# Patient Record
Sex: Female | Born: 1955 | Race: Black or African American | Hispanic: No | Marital: Single | Smoking: Former smoker
Health system: Southern US, Community
[De-identification: ages and names within clinical notes are randomized; demographics above are authoritative.]

## PROBLEM LIST (undated history)

## (undated) DIAGNOSIS — E785 Hyperlipidemia, unspecified: Secondary | ICD-10-CM

## (undated) DIAGNOSIS — Z9884 Bariatric surgery status: Secondary | ICD-10-CM

## (undated) DIAGNOSIS — I82409 Acute embolism and thrombosis of unspecified deep veins of unspecified lower extremity: Secondary | ICD-10-CM

## (undated) DIAGNOSIS — M329 Systemic lupus erythematosus, unspecified: Secondary | ICD-10-CM

## (undated) DIAGNOSIS — IMO0002 Reserved for concepts with insufficient information to code with codable children: Secondary | ICD-10-CM

## (undated) DIAGNOSIS — N189 Chronic kidney disease, unspecified: Secondary | ICD-10-CM

## (undated) DIAGNOSIS — G4733 Obstructive sleep apnea (adult) (pediatric): Secondary | ICD-10-CM

## (undated) DIAGNOSIS — K922 Gastrointestinal hemorrhage, unspecified: Secondary | ICD-10-CM

## (undated) DIAGNOSIS — R000 Tachycardia, unspecified: Secondary | ICD-10-CM

## (undated) DIAGNOSIS — D649 Anemia, unspecified: Secondary | ICD-10-CM

## (undated) HISTORY — PX: ABDOMINAL HYSTERECTOMY: SHX81

## (undated) HISTORY — DX: Anemia, unspecified: D64.9

## (undated) HISTORY — DX: Morbid (severe) obesity due to excess calories: E66.01

## (undated) HISTORY — PX: GASTRIC BYPASS: SHX52

## (undated) HISTORY — DX: Chronic kidney disease, unspecified: N18.9

## (undated) HISTORY — DX: Tachycardia, unspecified: R00.0

## (undated) HISTORY — DX: Reserved for concepts with insufficient information to code with codable children: IMO0002

## (undated) HISTORY — DX: Obstructive sleep apnea (adult) (pediatric): G47.33

## (undated) HISTORY — DX: Acute embolism and thrombosis of unspecified deep veins of unspecified lower extremity: I82.409

## (undated) HISTORY — DX: Systemic lupus erythematosus, unspecified: M32.9

## (undated) HISTORY — PX: INFERIOR VENA CAVA ANGIOPLASTY / STENTING: SHX1821

## (undated) HISTORY — DX: Hyperlipidemia, unspecified: E78.5

## (undated) HISTORY — DX: Gastrointestinal hemorrhage, unspecified: K92.2

---

## 2000-09-19 ENCOUNTER — Encounter: Admission: RE | Admit: 2000-09-19 | Discharge: 2000-09-19 | Payer: Self-pay | Admitting: Orthopedic Surgery

## 2000-09-19 ENCOUNTER — Encounter: Payer: Self-pay | Admitting: Orthopedic Surgery

## 2001-07-09 ENCOUNTER — Encounter: Admission: RE | Admit: 2001-07-09 | Discharge: 2001-07-09 | Payer: Self-pay | Admitting: Orthopedic Surgery

## 2001-07-09 ENCOUNTER — Encounter: Payer: Self-pay | Admitting: Orthopedic Surgery

## 2001-07-17 ENCOUNTER — Encounter: Payer: Self-pay | Admitting: Orthopedic Surgery

## 2001-07-17 ENCOUNTER — Encounter: Admission: RE | Admit: 2001-07-17 | Discharge: 2001-07-17 | Payer: Self-pay | Admitting: Orthopedic Surgery

## 2002-03-22 ENCOUNTER — Encounter: Payer: Self-pay | Admitting: Nephrology

## 2002-03-22 ENCOUNTER — Encounter: Admission: RE | Admit: 2002-03-22 | Discharge: 2002-03-22 | Payer: Self-pay | Admitting: Nephrology

## 2002-04-15 ENCOUNTER — Ambulatory Visit (HOSPITAL_COMMUNITY): Admission: RE | Admit: 2002-04-15 | Discharge: 2002-04-15 | Payer: Self-pay | Admitting: Nephrology

## 2002-04-16 ENCOUNTER — Ambulatory Visit (HOSPITAL_COMMUNITY): Admission: RE | Admit: 2002-04-16 | Discharge: 2002-04-17 | Payer: Self-pay | Admitting: Nephrology

## 2002-04-16 ENCOUNTER — Encounter (INDEPENDENT_AMBULATORY_CARE_PROVIDER_SITE_OTHER): Payer: Self-pay | Admitting: Specialist

## 2002-04-16 ENCOUNTER — Encounter: Payer: Self-pay | Admitting: Nephrology

## 2004-04-25 ENCOUNTER — Encounter (HOSPITAL_COMMUNITY): Admission: RE | Admit: 2004-04-25 | Discharge: 2004-07-24 | Payer: Self-pay | Admitting: Nephrology

## 2004-05-14 ENCOUNTER — Ambulatory Visit (HOSPITAL_BASED_OUTPATIENT_CLINIC_OR_DEPARTMENT_OTHER): Admission: RE | Admit: 2004-05-14 | Discharge: 2004-05-14 | Payer: Self-pay | Admitting: Family Medicine

## 2004-08-01 ENCOUNTER — Encounter (HOSPITAL_COMMUNITY): Admission: RE | Admit: 2004-08-01 | Discharge: 2004-10-30 | Payer: Self-pay | Admitting: Nephrology

## 2004-11-07 ENCOUNTER — Encounter (HOSPITAL_COMMUNITY): Admission: RE | Admit: 2004-11-07 | Discharge: 2005-02-05 | Payer: Self-pay | Admitting: Nephrology

## 2005-02-06 ENCOUNTER — Encounter (HOSPITAL_COMMUNITY): Admission: RE | Admit: 2005-02-06 | Discharge: 2005-05-07 | Payer: Self-pay | Admitting: Nephrology

## 2005-05-22 ENCOUNTER — Encounter (HOSPITAL_COMMUNITY): Admission: RE | Admit: 2005-05-22 | Discharge: 2005-08-20 | Payer: Self-pay | Admitting: Nephrology

## 2005-08-22 ENCOUNTER — Encounter (HOSPITAL_COMMUNITY): Admission: RE | Admit: 2005-08-22 | Discharge: 2005-11-20 | Payer: Self-pay | Admitting: Nephrology

## 2005-11-27 ENCOUNTER — Encounter (HOSPITAL_COMMUNITY): Admission: RE | Admit: 2005-11-27 | Discharge: 2006-02-25 | Payer: Self-pay | Admitting: Nephrology

## 2006-02-27 ENCOUNTER — Encounter (HOSPITAL_COMMUNITY): Admission: RE | Admit: 2006-02-27 | Discharge: 2006-05-28 | Payer: Self-pay | Admitting: Nephrology

## 2006-06-04 ENCOUNTER — Encounter (HOSPITAL_COMMUNITY): Admission: RE | Admit: 2006-06-04 | Discharge: 2006-08-01 | Payer: Self-pay | Admitting: Nephrology

## 2006-10-01 ENCOUNTER — Encounter (HOSPITAL_COMMUNITY): Admission: RE | Admit: 2006-10-01 | Discharge: 2006-12-30 | Payer: Self-pay | Admitting: Nephrology

## 2006-12-16 ENCOUNTER — Encounter: Admission: RE | Admit: 2006-12-16 | Discharge: 2007-03-16 | Payer: Self-pay | Admitting: Nephrology

## 2006-12-17 ENCOUNTER — Ambulatory Visit (HOSPITAL_COMMUNITY): Admission: RE | Admit: 2006-12-17 | Discharge: 2006-12-17 | Payer: Self-pay | Admitting: Family Medicine

## 2007-02-03 ENCOUNTER — Encounter: Admission: RE | Admit: 2007-02-03 | Discharge: 2007-02-03 | Payer: Self-pay | Admitting: Orthopaedic Surgery

## 2007-06-02 ENCOUNTER — Encounter (HOSPITAL_COMMUNITY): Admission: RE | Admit: 2007-06-02 | Discharge: 2007-08-31 | Payer: Self-pay | Admitting: Nephrology

## 2007-08-11 ENCOUNTER — Encounter: Admission: RE | Admit: 2007-08-11 | Discharge: 2007-08-11 | Payer: Self-pay | Admitting: Orthopaedic Surgery

## 2007-09-01 ENCOUNTER — Encounter: Admission: RE | Admit: 2007-09-01 | Discharge: 2007-11-27 | Payer: Self-pay | Admitting: Nephrology

## 2007-10-05 ENCOUNTER — Encounter: Admission: RE | Admit: 2007-10-05 | Discharge: 2008-01-03 | Payer: Self-pay | Admitting: Nephrology

## 2007-12-01 ENCOUNTER — Encounter (HOSPITAL_COMMUNITY): Admission: RE | Admit: 2007-12-01 | Discharge: 2008-02-29 | Payer: Self-pay | Admitting: Nephrology

## 2007-12-31 ENCOUNTER — Ambulatory Visit (HOSPITAL_COMMUNITY): Admission: RE | Admit: 2007-12-31 | Discharge: 2007-12-31 | Payer: Self-pay | Admitting: Surgery

## 2008-01-04 ENCOUNTER — Ambulatory Visit (HOSPITAL_COMMUNITY): Admission: RE | Admit: 2008-01-04 | Discharge: 2008-01-04 | Payer: Self-pay | Admitting: Surgery

## 2008-01-05 ENCOUNTER — Encounter: Admission: RE | Admit: 2008-01-05 | Discharge: 2008-01-05 | Payer: Self-pay | Admitting: Orthopaedic Surgery

## 2008-01-06 ENCOUNTER — Encounter: Admission: RE | Admit: 2008-01-06 | Discharge: 2008-01-06 | Payer: Self-pay | Admitting: Surgery

## 2008-03-22 ENCOUNTER — Encounter (HOSPITAL_COMMUNITY): Admission: RE | Admit: 2008-03-22 | Discharge: 2008-06-20 | Payer: Self-pay | Admitting: Nephrology

## 2008-05-06 ENCOUNTER — Encounter: Admission: RE | Admit: 2008-05-06 | Discharge: 2008-06-02 | Payer: Self-pay | Admitting: Surgery

## 2008-05-23 ENCOUNTER — Inpatient Hospital Stay (HOSPITAL_COMMUNITY): Admission: RE | Admit: 2008-05-23 | Discharge: 2008-05-26 | Payer: Self-pay | Admitting: Surgery

## 2008-05-24 ENCOUNTER — Ambulatory Visit: Payer: Self-pay | Admitting: Surgery

## 2008-05-24 ENCOUNTER — Encounter (INDEPENDENT_AMBULATORY_CARE_PROVIDER_SITE_OTHER): Payer: Self-pay | Admitting: Surgery

## 2008-06-22 ENCOUNTER — Encounter (HOSPITAL_COMMUNITY): Admission: RE | Admit: 2008-06-22 | Discharge: 2008-09-20 | Payer: Self-pay | Admitting: Nephrology

## 2008-09-21 ENCOUNTER — Encounter (HOSPITAL_COMMUNITY): Admission: RE | Admit: 2008-09-21 | Discharge: 2008-11-17 | Payer: Self-pay | Admitting: Nephrology

## 2008-11-18 HISTORY — PX: LAPAROSCOPIC GASTRIC BYPASS: SUR771

## 2008-11-30 ENCOUNTER — Encounter (HOSPITAL_COMMUNITY): Admission: RE | Admit: 2008-11-30 | Discharge: 2009-02-28 | Payer: Self-pay | Admitting: Nephrology

## 2009-03-01 ENCOUNTER — Encounter (HOSPITAL_COMMUNITY): Admission: RE | Admit: 2009-03-01 | Discharge: 2009-05-30 | Payer: Self-pay | Admitting: Nephrology

## 2009-05-31 ENCOUNTER — Encounter (HOSPITAL_COMMUNITY): Admission: RE | Admit: 2009-05-31 | Discharge: 2009-08-29 | Payer: Self-pay | Admitting: Nephrology

## 2009-09-27 ENCOUNTER — Encounter (HOSPITAL_COMMUNITY): Admission: RE | Admit: 2009-09-27 | Discharge: 2009-12-26 | Payer: Self-pay | Admitting: Nephrology

## 2009-10-17 ENCOUNTER — Inpatient Hospital Stay (HOSPITAL_COMMUNITY): Admission: EM | Admit: 2009-10-17 | Discharge: 2009-10-19 | Payer: Self-pay | Admitting: Emergency Medicine

## 2009-10-17 ENCOUNTER — Encounter (INDEPENDENT_AMBULATORY_CARE_PROVIDER_SITE_OTHER): Payer: Self-pay | Admitting: Internal Medicine

## 2009-12-13 ENCOUNTER — Inpatient Hospital Stay (HOSPITAL_COMMUNITY): Admission: AD | Admit: 2009-12-13 | Discharge: 2009-12-14 | Payer: Self-pay | Admitting: Nephrology

## 2009-12-28 ENCOUNTER — Encounter (HOSPITAL_COMMUNITY): Admission: RE | Admit: 2009-12-28 | Discharge: 2010-03-28 | Payer: Self-pay | Admitting: Nephrology

## 2010-03-05 ENCOUNTER — Inpatient Hospital Stay (HOSPITAL_COMMUNITY): Admission: EM | Admit: 2010-03-05 | Discharge: 2010-03-07 | Payer: Self-pay | Admitting: Emergency Medicine

## 2010-03-05 ENCOUNTER — Ambulatory Visit: Payer: Self-pay | Admitting: Surgery

## 2010-03-06 ENCOUNTER — Encounter (INDEPENDENT_AMBULATORY_CARE_PROVIDER_SITE_OTHER): Payer: Self-pay | Admitting: Internal Medicine

## 2010-03-11 ENCOUNTER — Inpatient Hospital Stay (HOSPITAL_COMMUNITY): Admission: EM | Admit: 2010-03-11 | Discharge: 2010-03-16 | Payer: Self-pay | Admitting: Emergency Medicine

## 2010-03-15 ENCOUNTER — Encounter (INDEPENDENT_AMBULATORY_CARE_PROVIDER_SITE_OTHER): Payer: Self-pay | Admitting: Internal Medicine

## 2010-03-29 ENCOUNTER — Encounter (HOSPITAL_COMMUNITY): Admission: RE | Admit: 2010-03-29 | Discharge: 2010-06-27 | Payer: Self-pay | Admitting: Nephrology

## 2010-06-28 ENCOUNTER — Encounter (HOSPITAL_COMMUNITY): Admission: RE | Admit: 2010-06-28 | Discharge: 2010-09-26 | Payer: Self-pay | Admitting: Nephrology

## 2010-08-31 ENCOUNTER — Ambulatory Visit: Payer: Self-pay | Admitting: Vascular Surgery

## 2010-09-24 ENCOUNTER — Ambulatory Visit (HOSPITAL_COMMUNITY): Admission: RE | Admit: 2010-09-24 | Discharge: 2010-09-24 | Payer: Self-pay | Admitting: Vascular Surgery

## 2010-10-04 ENCOUNTER — Encounter (HOSPITAL_COMMUNITY)
Admission: RE | Admit: 2010-10-04 | Discharge: 2010-12-18 | Payer: Self-pay | Source: Home / Self Care | Attending: Nephrology | Admitting: Nephrology

## 2010-10-26 ENCOUNTER — Ambulatory Visit: Payer: Self-pay | Admitting: Vascular Surgery

## 2010-11-22 LAB — IRON AND TIBC
Iron: 119 ug/dL (ref 42–135)
Saturation Ratios: 55 % (ref 20–55)
TIBC: 218 ug/dL — ABNORMAL LOW (ref 250–470)
UIBC: 99 ug/dL

## 2010-11-22 LAB — POCT HEMOGLOBIN-HEMACUE: Hemoglobin: 8.1 g/dL — ABNORMAL LOW (ref 12.0–15.0)

## 2010-11-22 LAB — FERRITIN: Ferritin: 907 ng/mL — ABNORMAL HIGH (ref 10–291)

## 2010-12-03 LAB — POCT HEMOGLOBIN-HEMACUE: Hemoglobin: 7.6 g/dL — ABNORMAL LOW (ref 12.0–15.0)

## 2010-12-09 ENCOUNTER — Encounter: Payer: Self-pay | Admitting: Gastroenterology

## 2010-12-10 ENCOUNTER — Other Ambulatory Visit: Payer: Self-pay | Admitting: Gastroenterology

## 2010-12-10 LAB — CBC
Hemoglobin: 7.3 g/dL — ABNORMAL LOW (ref 12.0–15.0)
MCH: 28.2 pg (ref 26.0–34.0)
MCV: 96.1 fL (ref 78.0–100.0)
Platelets: 301 10*3/uL (ref 150–400)
RBC: 2.59 MIL/uL — ABNORMAL LOW (ref 3.87–5.11)
WBC: 11.8 10*3/uL — ABNORMAL HIGH (ref 4.0–10.5)

## 2010-12-13 LAB — POCT HEMOGLOBIN-HEMACUE: Hemoglobin: 7.9 g/dL — ABNORMAL LOW (ref 12.0–15.0)

## 2010-12-20 ENCOUNTER — Other Ambulatory Visit: Payer: Self-pay | Admitting: Nephrology

## 2010-12-20 ENCOUNTER — Other Ambulatory Visit: Payer: Self-pay

## 2010-12-20 ENCOUNTER — Ambulatory Visit (HOSPITAL_COMMUNITY): Payer: 59 | Attending: Nephrology

## 2010-12-20 DIAGNOSIS — D638 Anemia in other chronic diseases classified elsewhere: Secondary | ICD-10-CM | POA: Insufficient documentation

## 2010-12-20 DIAGNOSIS — N185 Chronic kidney disease, stage 5: Secondary | ICD-10-CM | POA: Insufficient documentation

## 2010-12-20 LAB — IRON AND TIBC
Saturation Ratios: 17 % — ABNORMAL LOW (ref 20–55)
TIBC: 194 ug/dL — ABNORMAL LOW (ref 250–470)
UIBC: 161 ug/dL

## 2010-12-27 ENCOUNTER — Encounter (HOSPITAL_COMMUNITY): Payer: 59 | Attending: Nephrology

## 2010-12-27 ENCOUNTER — Other Ambulatory Visit: Payer: Self-pay

## 2010-12-27 DIAGNOSIS — N185 Chronic kidney disease, stage 5: Secondary | ICD-10-CM | POA: Insufficient documentation

## 2010-12-27 DIAGNOSIS — D638 Anemia in other chronic diseases classified elsewhere: Secondary | ICD-10-CM | POA: Insufficient documentation

## 2010-12-27 LAB — POCT HEMOGLOBIN-HEMACUE: Hemoglobin: 11.1 g/dL — ABNORMAL LOW (ref 12.0–15.0)

## 2011-01-03 ENCOUNTER — Other Ambulatory Visit: Payer: Self-pay

## 2011-01-03 ENCOUNTER — Encounter (HOSPITAL_COMMUNITY): Payer: 59

## 2011-01-04 ENCOUNTER — Ambulatory Visit (INDEPENDENT_AMBULATORY_CARE_PROVIDER_SITE_OTHER): Payer: 59 | Admitting: Vascular Surgery

## 2011-01-04 DIAGNOSIS — N186 End stage renal disease: Secondary | ICD-10-CM

## 2011-01-04 NOTE — Assessment & Plan Note (Signed)
OFFICE VISIT  [name], [name] DOB:  [birth year]                                       01/04/2011 ZOXWR#:[id number]  This is an established patient.  HISTORY OF PRESENT ILLNESS:  This is a 55 year old female who underwent a first stage basilic vein transposition on 09/24/2010.  Since then the patient has followed up and on subsequent followup was found to have no obvious thrill within the basilic vein transposition.  At this point the patient has no steal symptomatology, has full use of the left hand for activities of daily living and currently is in the process of transplant evaluation at [facility].  She does not note any deterioration of renal function at this point and per her most recent visit with her nephrologist is not in the 1 to 2 month window for imminent end-stage renal disease.  PHYSICAL EXAMINATION:  Vital signs:  She had a blood pressure of 103/70 with a heart rate of 72, respirations were 12, temperature 97.4.  On focused examination:  Her left arm has a well-healed incision.  There is no palpable thrill here, easily palpable brachial pulse and a weak radial pulse.  She has 5/5 hand grip in the left hand, sensation is grossly intact in the fingertips.  There is intact intrinsic/extrinsic motor strength in this hand.  Cardiac:  She had a regular rate and rhythm.  Normal S1-S2.  No murmurs, rubs, thrills or gallops. Pulmonary:  Symmetric expansion, good air movement, no rales, rhonchi or wheezing.  Musculoskeletal:  She had 5/5 strength in all extremities. There was no edema in either extremities.  MEDICAL DECISION MAKING:  This is a 55 year old female status post failed left basilic vein transposition.  In reviewing the patient's vein mapping she has no other options for fistula placement.  At this point she is still in chronic kidney disease and has not entered into end- stage renal.  As she is not within the 1-2 month window of imminent  end- stage renal disease I do not see an advantage to proceeding with graft placement.  She is going to continue with her transplant evaluations and then once she enters into the 1-2 month window then will schedule her for a left arm forearm arteriovenous graft or upper arm arteriovenous graft placement depending on the presence of acceptable brachial vein on exploration intraop.  We discussed this with the patient.  At this point she is fine with this and will continue with this plan.    Fransisco Hertz, MD Electronically Signed  BLC/MEDQ  D:  01/04/2011  T:  01/04/2011  Job:  [id number]

## 2011-01-08 LAB — POCT HEMOGLOBIN-HEMACUE: Hemoglobin: 12.1 g/dL (ref 12.0–15.0)

## 2011-01-17 ENCOUNTER — Encounter (HOSPITAL_COMMUNITY): Payer: 59 | Attending: Nephrology

## 2011-01-17 ENCOUNTER — Other Ambulatory Visit: Payer: Self-pay | Admitting: Nephrology

## 2011-01-17 ENCOUNTER — Other Ambulatory Visit: Payer: Self-pay

## 2011-01-17 DIAGNOSIS — N185 Chronic kidney disease, stage 5: Secondary | ICD-10-CM | POA: Insufficient documentation

## 2011-01-17 DIAGNOSIS — D638 Anemia in other chronic diseases classified elsewhere: Secondary | ICD-10-CM | POA: Insufficient documentation

## 2011-01-17 LAB — FERRITIN: Ferritin: 1075 ng/mL — ABNORMAL HIGH (ref 10–291)

## 2011-01-22 ENCOUNTER — Other Ambulatory Visit: Payer: Self-pay | Admitting: Radiology

## 2011-01-24 ENCOUNTER — Encounter (HOSPITAL_COMMUNITY): Payer: 59

## 2011-01-24 ENCOUNTER — Other Ambulatory Visit: Payer: Self-pay

## 2011-01-24 LAB — POCT HEMOGLOBIN-HEMACUE: Hemoglobin: 11.9 g/dL — ABNORMAL LOW (ref 12.0–15.0)

## 2011-01-28 LAB — RENAL FUNCTION PANEL
Albumin: 3.8 g/dL (ref 3.5–5.2)
Albumin: 3.6 g/dL (ref 3.5–5.2)
Calcium: 8.7 mg/dL (ref 8.4–10.5)
Chloride: 101 mEq/L (ref 96–112)
Creatinine, Ser: 4.31 mg/dL — ABNORMAL HIGH (ref 0.4–1.2)
GFR calc Af Amer: 13 mL/min — ABNORMAL LOW (ref >60)
GFR calc non Af Amer: 11 mL/min — ABNORMAL LOW (ref >60)
Glucose, Bld: 72 mg/dL (ref 70–99)
Phosphorus: 5 mg/dL — ABNORMAL HIGH (ref 2.3–4.6)
Phosphorus: 4.9 mg/dL — ABNORMAL HIGH (ref 2.3–4.6)
Potassium: 3.5 mEq/L (ref 3.5–5.1)
Sodium: 142 mEq/L (ref 135–145)
Sodium: 136 mEq/L (ref 135–145)

## 2011-01-28 LAB — CBC
HCT: 32.9 % — ABNORMAL LOW (ref 36.0–46.0)
MCH: 28.3 pg (ref 26.0–34.0)
MCHC: 29.7 g/dL — ABNORMAL LOW (ref 30.0–36.0)
Platelets: 325 10*3/uL (ref 150–400)
RBC: 3.44 MIL/uL — ABNORMAL LOW (ref 3.87–5.11)
RDW: 15.2 % (ref 11.5–15.5)
RDW: 14.8 % (ref 11.5–15.5)
WBC: 10.7 10*3/uL — ABNORMAL HIGH (ref 4.0–10.5)

## 2011-01-28 LAB — POCT HEMOGLOBIN-HEMACUE
Hemoglobin: 8.7 g/dL — ABNORMAL LOW (ref 12.0–15.0)
Hemoglobin: 9.5 g/dL — ABNORMAL LOW (ref 12.0–15.0)

## 2011-01-28 LAB — IRON AND TIBC: TIBC: 224 ug/dL — ABNORMAL LOW (ref 250–470)

## 2011-01-28 LAB — C4 COMPLEMENT: Complement C4, Body Fluid: 13 mg/dL — ABNORMAL LOW (ref 16–47)

## 2011-01-28 LAB — FERRITIN: Ferritin: 859 ng/mL — ABNORMAL HIGH (ref 10–291)

## 2011-01-28 LAB — C3 COMPLEMENT: C3 Complement: 80 mg/dL — ABNORMAL LOW (ref 88–201)

## 2011-01-29 LAB — IRON AND TIBC
Iron: 71 ug/dL (ref 42–135)
Saturation Ratios: 30 % (ref 20–55)
TIBC: 236 ug/dL — ABNORMAL LOW (ref 250–470)

## 2011-01-29 LAB — SURGICAL PCR SCREEN: MRSA, PCR: POSITIVE — AB

## 2011-01-29 LAB — CBC
HCT: 33.1 % — ABNORMAL LOW (ref 36.0–46.0)
MCHC: 30.2 g/dL (ref 30.0–36.0)
Platelets: 273 10*3/uL (ref 150–400)
RDW: 15.1 % (ref 11.5–15.5)
WBC: 8.9 10*3/uL (ref 4.0–10.5)

## 2011-01-29 LAB — BASIC METABOLIC PANEL
BUN: 89 mg/dL — ABNORMAL HIGH (ref 6–23)
Calcium: 8.4 mg/dL (ref 8.4–10.5)
Creatinine, Ser: 3.48 mg/dL — ABNORMAL HIGH (ref 0.4–1.2)
GFR calc non Af Amer: 14 mL/min — ABNORMAL LOW (ref >60)
Glucose, Bld: 109 mg/dL — ABNORMAL HIGH (ref 70–99)

## 2011-01-29 LAB — FERRITIN: Ferritin: 746 ng/mL — ABNORMAL HIGH (ref 10–291)

## 2011-01-29 LAB — POCT I-STAT 4, (NA,K, GLUC, HGB,HCT): HCT: 28 % — ABNORMAL LOW (ref 36.0–46.0)

## 2011-01-29 LAB — POCT HEMOGLOBIN-HEMACUE: Hemoglobin: 9.6 g/dL — ABNORMAL LOW (ref 12.0–15.0)

## 2011-01-30 LAB — POCT HEMOGLOBIN-HEMACUE
Hemoglobin: 9.7 g/dL — ABNORMAL LOW (ref 12.0–15.0)
Hemoglobin: 9.6 g/dL — ABNORMAL LOW (ref 12.0–15.0)

## 2011-01-30 LAB — IRON AND TIBC
Iron: 96 ug/dL (ref 42–135)
Saturation Ratios: 40 % (ref 20–55)
TIBC: 242 ug/dL — ABNORMAL LOW (ref 250–470)
UIBC: 146 ug/dL

## 2011-01-30 LAB — FERRITIN: Ferritin: 1045 ng/mL — ABNORMAL HIGH (ref 10–291)

## 2011-01-31 ENCOUNTER — Encounter (HOSPITAL_COMMUNITY): Payer: 59

## 2011-01-31 LAB — POCT HEMOGLOBIN-HEMACUE: Hemoglobin: 10.4 g/dL — ABNORMAL LOW (ref 12.0–15.0)

## 2011-01-31 LAB — IRON AND TIBC
Iron: 101 ug/dL (ref 42–135)
TIBC: 251 ug/dL (ref 250–470)
UIBC: 150 ug/dL

## 2011-01-31 LAB — FERRITIN: Ferritin: 1017 ng/mL — ABNORMAL HIGH (ref 10–291)

## 2011-02-01 LAB — FERRITIN: Ferritin: 1163 ng/mL — ABNORMAL HIGH (ref 10–291)

## 2011-02-01 LAB — POCT HEMOGLOBIN-HEMACUE: Hemoglobin: 10.8 g/dL — ABNORMAL LOW (ref 12.0–15.0)

## 2011-02-01 LAB — IRON AND TIBC: Iron: 86 ug/dL (ref 42–135)

## 2011-02-03 LAB — C4 COMPLEMENT: Complement C4, Body Fluid: 11 mg/dL — ABNORMAL LOW (ref 16–47)

## 2011-02-03 LAB — CALCIUM, IONIZED: Calcium, Ion: 0.9 mmol/L — ABNORMAL LOW (ref 1.12–1.32)

## 2011-02-03 LAB — RENAL FUNCTION PANEL
CO2: 31 mEq/L (ref 19–32)
Glucose, Bld: 85 mg/dL (ref 70–99)
Phosphorus: 4.1 mg/dL (ref 2.3–4.6)
Potassium: 3.5 mEq/L (ref 3.5–5.1)
Sodium: 145 mEq/L (ref 135–145)

## 2011-02-03 LAB — IRON AND TIBC
Iron: 40 ug/dL — ABNORMAL LOW (ref 42–135)
Saturation Ratios: 18 % — ABNORMAL LOW (ref 20–55)
TIBC: 225 ug/dL — ABNORMAL LOW (ref 250–470)

## 2011-02-03 LAB — COMPREHENSIVE METABOLIC PANEL
AST: 36 U/L (ref 0–37)
Albumin: 2.6 g/dL — ABNORMAL LOW (ref 3.5–5.2)
Alkaline Phosphatase: 161 U/L — ABNORMAL HIGH (ref 39–117)
Chloride: 93 mEq/L — ABNORMAL LOW (ref 96–112)
GFR calc Af Amer: 16 mL/min — ABNORMAL LOW (ref >60)
Potassium: 3 mEq/L — ABNORMAL LOW (ref 3.5–5.1)
Total Bilirubin: 0.5 mg/dL (ref 0.3–1.2)

## 2011-02-03 LAB — URINALYSIS, ROUTINE W REFLEX MICROSCOPIC
Glucose, UA: NEGATIVE mg/dL
Specific Gravity, Urine: 1.011 (ref 1.005–1.030)
pH: 6 (ref 5.0–8.0)

## 2011-02-03 LAB — CBC
HCT: 27.7 % — ABNORMAL LOW (ref 36.0–46.0)
Hemoglobin: 8.9 g/dL — ABNORMAL LOW (ref 12.0–15.0)
Platelets: 243 10*3/uL (ref 150–400)
RBC: 2.94 MIL/uL — ABNORMAL LOW (ref 3.87–5.11)
WBC: 11.5 10*3/uL — ABNORMAL HIGH (ref 4.0–10.5)

## 2011-02-03 LAB — POCT HEMOGLOBIN-HEMACUE: Hemoglobin: 11.5 g/dL — ABNORMAL LOW (ref 12.0–15.0)

## 2011-02-04 LAB — IRON AND TIBC: TIBC: 190 ug/dL — ABNORMAL LOW (ref 250–470)

## 2011-02-04 LAB — POCT HEMOGLOBIN-HEMACUE
Hemoglobin: 12.6 g/dL (ref 12.0–15.0)
Hemoglobin: 10.8 g/dL — ABNORMAL LOW (ref 12.0–15.0)

## 2011-02-05 LAB — DIFFERENTIAL
Basophils Absolute: 0 10*3/uL (ref 0.0–0.1)
Basophils Absolute: 0.2 10*3/uL — ABNORMAL HIGH (ref 0.0–0.1)
Basophils Relative: 0 % (ref 0–1)
Basophils Relative: 1 % (ref 0–1)
Eosinophils Absolute: 0 10*3/uL (ref 0.0–0.7)
Eosinophils Relative: 0 % (ref 0–5)
Eosinophils Relative: 1 % (ref 0–5)
Monocytes Absolute: 0.7 10*3/uL (ref 0.1–1.0)
Monocytes Absolute: 1.1 10*3/uL — ABNORMAL HIGH (ref 0.1–1.0)
Neutro Abs: 8.3 10*3/uL — ABNORMAL HIGH (ref 1.7–7.7)

## 2011-02-05 LAB — CBC
HCT: 25.4 % — ABNORMAL LOW (ref 36.0–46.0)
HCT: 27.6 % — ABNORMAL LOW (ref 36.0–46.0)
HCT: 28.3 % — ABNORMAL LOW (ref 36.0–46.0)
HCT: 32.1 % — ABNORMAL LOW (ref 36.0–46.0)
HCT: 34 % — ABNORMAL LOW (ref 36.0–46.0)
Hemoglobin: 10.3 g/dL — ABNORMAL LOW (ref 12.0–15.0)
Hemoglobin: 11 g/dL — ABNORMAL LOW (ref 12.0–15.0)
Hemoglobin: 8.1 g/dL — ABNORMAL LOW (ref 12.0–15.0)
MCHC: 32.3 g/dL (ref 30.0–36.0)
MCHC: 32.5 g/dL (ref 30.0–36.0)
MCV: 95.6 fL (ref 78.0–100.0)
MCV: 95.7 fL (ref 78.0–100.0)
MCV: 95.8 fL (ref 78.0–100.0)
Platelets: 180 10*3/uL (ref 150–400)
Platelets: 235 10*3/uL (ref 150–400)
Platelets: 244 10*3/uL (ref 150–400)
Platelets: 261 10*3/uL (ref 150–400)
Platelets: 269 10*3/uL (ref 150–400)
Platelets: 277 10*3/uL (ref 150–400)
RDW: 16.3 % — ABNORMAL HIGH (ref 11.5–15.5)
RDW: 16.6 % — ABNORMAL HIGH (ref 11.5–15.5)
RDW: 16.9 % — ABNORMAL HIGH (ref 11.5–15.5)
RDW: 16.9 % — ABNORMAL HIGH (ref 11.5–15.5)
RDW: 17.2 % — ABNORMAL HIGH (ref 11.5–15.5)
WBC: 10.1 10*3/uL (ref 4.0–10.5)
WBC: 11.4 10*3/uL — ABNORMAL HIGH (ref 4.0–10.5)
WBC: 8.1 10*3/uL (ref 4.0–10.5)
WBC: 8.6 10*3/uL (ref 4.0–10.5)

## 2011-02-05 LAB — CARDIAC PANEL(CRET KIN+CKTOT+MB+TROPI)
CK, MB: 0.9 ng/mL (ref 0.3–4.0)
Relative Index: INVALID (ref 0.0–2.5)
Relative Index: INVALID (ref 0.0–2.5)
Total CK: 44 U/L (ref 7–177)
Troponin I: 0.02 ng/mL (ref 0.00–0.06)
Troponin I: 0.02 ng/mL (ref 0.00–0.06)
Troponin I: 0.03 ng/mL (ref 0.00–0.06)

## 2011-02-05 LAB — BASIC METABOLIC PANEL
BUN: 106 mg/dL — ABNORMAL HIGH (ref 6–23)
BUN: 82 mg/dL — ABNORMAL HIGH (ref 6–23)
BUN: 95 mg/dL — ABNORMAL HIGH (ref 6–23)
CO2: 25 mEq/L (ref 19–32)
CO2: 25 mEq/L (ref 19–32)
CO2: 25 mEq/L (ref 19–32)
CO2: 26 mEq/L (ref 19–32)
CO2: 27 mEq/L (ref 19–32)
CO2: 28 mEq/L (ref 19–32)
CO2: 28 mEq/L (ref 19–32)
Calcium: 6.6 mg/dL — ABNORMAL LOW (ref 8.4–10.5)
Calcium: 6.8 mg/dL — ABNORMAL LOW (ref 8.4–10.5)
Calcium: 6.9 mg/dL — ABNORMAL LOW (ref 8.4–10.5)
Chloride: 100 mEq/L (ref 96–112)
Chloride: 102 mEq/L (ref 96–112)
Chloride: 102 mEq/L (ref 96–112)
Chloride: 104 mEq/L (ref 96–112)
Chloride: 107 mEq/L (ref 96–112)
Creatinine, Ser: 3.67 mg/dL — ABNORMAL HIGH (ref 0.4–1.2)
Creatinine, Ser: 3.75 mg/dL — ABNORMAL HIGH (ref 0.4–1.2)
Creatinine, Ser: 3.94 mg/dL — ABNORMAL HIGH (ref 0.4–1.2)
GFR calc Af Amer: 14 mL/min — ABNORMAL LOW (ref >60)
GFR calc Af Amer: 15 mL/min — ABNORMAL LOW (ref >60)
GFR calc Af Amer: 15 mL/min — ABNORMAL LOW (ref >60)
GFR calc Af Amer: 16 mL/min — ABNORMAL LOW (ref >60)
GFR calc non Af Amer: 12 mL/min — ABNORMAL LOW (ref >60)
GFR calc non Af Amer: 13 mL/min — ABNORMAL LOW (ref >60)
Glucose, Bld: 76 mg/dL (ref 70–99)
Glucose, Bld: 80 mg/dL (ref 70–99)
Glucose, Bld: 81 mg/dL (ref 70–99)
Potassium: 2.9 mEq/L — ABNORMAL LOW (ref 3.5–5.1)
Potassium: 3.1 mEq/L — ABNORMAL LOW (ref 3.5–5.1)
Potassium: 3.2 mEq/L — ABNORMAL LOW (ref 3.5–5.1)
Potassium: 3.5 mEq/L (ref 3.5–5.1)
Sodium: 137 mEq/L (ref 135–145)
Sodium: 140 mEq/L (ref 135–145)
Sodium: 141 mEq/L (ref 135–145)
Sodium: 143 mEq/L (ref 135–145)

## 2011-02-05 LAB — HEPARIN LEVEL (UNFRACTIONATED)
Heparin Unfractionated: 0.9 IU/mL — ABNORMAL HIGH (ref 0.30–0.70)
Heparin Unfractionated: 1.01 IU/mL — ABNORMAL HIGH (ref 0.30–0.70)

## 2011-02-05 LAB — RENAL FUNCTION PANEL
CO2: 29 mEq/L (ref 19–32)
CO2: 30 mEq/L (ref 19–32)
Calcium: 6.7 mg/dL — ABNORMAL LOW (ref 8.4–10.5)
Calcium: 6.8 mg/dL — ABNORMAL LOW (ref 8.4–10.5)
Creatinine, Ser: 3.45 mg/dL — ABNORMAL HIGH (ref 0.4–1.2)
Creatinine, Ser: 3.65 mg/dL — ABNORMAL HIGH (ref 0.4–1.2)
GFR calc Af Amer: 16 mL/min — ABNORMAL LOW (ref >60)
GFR calc Af Amer: 17 mL/min — ABNORMAL LOW (ref >60)
GFR calc non Af Amer: 13 mL/min — ABNORMAL LOW (ref >60)
Glucose, Bld: 87 mg/dL (ref 70–99)
Phosphorus: 3.9 mg/dL (ref 2.3–4.6)
Phosphorus: 4.1 mg/dL (ref 2.3–4.6)
Sodium: 138 mEq/L (ref 135–145)

## 2011-02-05 LAB — COMPREHENSIVE METABOLIC PANEL
ALT: 17 U/L (ref 0–35)
AST: 20 U/L (ref 0–37)
Albumin: 2.8 g/dL — ABNORMAL LOW (ref 3.5–5.2)
Alkaline Phosphatase: 75 U/L (ref 39–117)
BUN: 106 mg/dL — ABNORMAL HIGH (ref 6–23)
Chloride: 103 mEq/L (ref 96–112)
Potassium: 4.7 mEq/L (ref 3.5–5.1)
Total Bilirubin: 0.5 mg/dL (ref 0.3–1.2)

## 2011-02-05 LAB — PROTIME-INR
INR: 1.32 (ref 0.00–1.49)
INR: 2.25 — ABNORMAL HIGH (ref 0.00–1.49)
Prothrombin Time: 15.5 seconds — ABNORMAL HIGH (ref 11.6–15.2)
Prothrombin Time: 17.2 seconds — ABNORMAL HIGH (ref 11.6–15.2)
Prothrombin Time: 20 seconds — ABNORMAL HIGH (ref 11.6–15.2)
Prothrombin Time: 24.7 seconds — ABNORMAL HIGH (ref 11.6–15.2)
Prothrombin Time: 27.2 seconds — ABNORMAL HIGH (ref 11.6–15.2)

## 2011-02-05 LAB — URINE CULTURE
Colony Count: >=100000
Special Requests: NEGATIVE

## 2011-02-05 LAB — POCT HEMOGLOBIN-HEMACUE: Hemoglobin: 11.3 g/dL — ABNORMAL LOW (ref 12.0–15.0)

## 2011-02-05 LAB — URINE MICROSCOPIC-ADD ON

## 2011-02-05 LAB — ABO/RH: ABO/RH(D): A POS

## 2011-02-05 LAB — IRON AND TIBC
Iron: 44 ug/dL (ref 42–135)
UIBC: 115 ug/dL

## 2011-02-05 LAB — HEMOGLOBIN AND HEMATOCRIT, BLOOD
HCT: 26.5 % — ABNORMAL LOW (ref 36.0–46.0)
HCT: 29.2 % — ABNORMAL LOW (ref 36.0–46.0)
Hemoglobin: 8.5 g/dL — ABNORMAL LOW (ref 12.0–15.0)

## 2011-02-05 LAB — URINALYSIS, ROUTINE W REFLEX MICROSCOPIC
Bilirubin Urine: NEGATIVE
Ketones, ur: NEGATIVE mg/dL
Ketones, ur: NEGATIVE mg/dL
Nitrite: NEGATIVE
Protein, ur: NEGATIVE mg/dL
Protein, ur: NEGATIVE mg/dL
Urobilinogen, UA: 0.2 mg/dL (ref 0.0–1.0)
Urobilinogen, UA: 0.2 mg/dL (ref 0.0–1.0)

## 2011-02-05 LAB — CROSSMATCH

## 2011-02-05 LAB — FACTOR 5 LEIDEN

## 2011-02-06 LAB — FERRITIN: Ferritin: 1419 ng/mL — ABNORMAL HIGH (ref 10–291)

## 2011-02-06 LAB — PROTEIN / CREATININE RATIO, URINE
Protein Creatinine Ratio: 0.4 — ABNORMAL HIGH (ref 0.00–0.15)
Total Protein, Urine: 17 mg/dL

## 2011-02-06 LAB — RENAL FUNCTION PANEL
Albumin: 2.6 g/dL — ABNORMAL LOW (ref 3.5–5.2)
BUN: 78 mg/dL — ABNORMAL HIGH (ref 6–23)
Calcium: 8.4 mg/dL (ref 8.4–10.5)
Creatinine, Ser: 2.94 mg/dL — ABNORMAL HIGH (ref 0.4–1.2)
GFR calc Af Amer: 20 mL/min — ABNORMAL LOW (ref >60)
GFR calc non Af Amer: 17 mL/min — ABNORMAL LOW (ref >60)
Phosphorus: 4.4 mg/dL (ref 2.3–4.6)

## 2011-02-06 LAB — IRON AND TIBC
Saturation Ratios: 46 % (ref 20–55)
TIBC: 110 ug/dL — ABNORMAL LOW (ref 250–470)
UIBC: 105 ug/dL

## 2011-02-06 LAB — POCT HEMOGLOBIN-HEMACUE: Hemoglobin: 9.5 g/dL — ABNORMAL LOW (ref 12.0–15.0)

## 2011-02-10 LAB — RENAL FUNCTION PANEL
CO2: 18 mEq/L — ABNORMAL LOW (ref 19–32)
Chloride: 113 mEq/L — ABNORMAL HIGH (ref 96–112)
Glucose, Bld: 94 mg/dL (ref 70–99)
Phosphorus: 5.5 mg/dL — ABNORMAL HIGH (ref 2.3–4.6)
Potassium: 4.2 mEq/L (ref 3.5–5.1)
Sodium: 140 mEq/L (ref 135–145)

## 2011-02-10 LAB — IRON AND TIBC
Iron: 112 ug/dL (ref 42–135)
Saturation Ratios: 25 % (ref 20–55)
Saturation Ratios: 65 % — ABNORMAL HIGH (ref 20–55)

## 2011-02-10 LAB — POCT HEMOGLOBIN-HEMACUE
Hemoglobin: 10.2 g/dL — ABNORMAL LOW (ref 12.0–15.0)
Hemoglobin: 7.7 g/dL — ABNORMAL LOW (ref 12.0–15.0)
Hemoglobin: 9.2 g/dL — ABNORMAL LOW (ref 12.0–15.0)
Hemoglobin: 9.9 g/dL — ABNORMAL LOW (ref 12.0–15.0)

## 2011-02-10 LAB — CBC
HCT: 27 % — ABNORMAL LOW (ref 36.0–46.0)
Hemoglobin: 8.7 g/dL — ABNORMAL LOW (ref 12.0–15.0)
RBC: 2.75 MIL/uL — ABNORMAL LOW (ref 3.87–5.11)
RDW: 19.6 % — ABNORMAL HIGH (ref 11.5–15.5)
WBC: 10.8 10*3/uL — ABNORMAL HIGH (ref 4.0–10.5)

## 2011-02-13 ENCOUNTER — Encounter (HOSPITAL_COMMUNITY): Payer: 59

## 2011-02-13 ENCOUNTER — Other Ambulatory Visit: Payer: Self-pay | Admitting: Nephrology

## 2011-02-13 LAB — POCT HEMOGLOBIN-HEMACUE: Hemoglobin: 12.7 g/dL (ref 12.0–15.0)

## 2011-02-18 LAB — POCT HEMOGLOBIN-HEMACUE: Hemoglobin: 9.9 g/dL — ABNORMAL LOW (ref 12.0–15.0)

## 2011-02-18 LAB — IRON AND TIBC

## 2011-02-19 LAB — CBC
HCT: 27 % — ABNORMAL LOW (ref 36.0–46.0)
Hemoglobin: 8.8 g/dL — ABNORMAL LOW (ref 12.0–15.0)
MCHC: 32.4 g/dL (ref 30.0–36.0)
RDW: 16.8 % — ABNORMAL HIGH (ref 11.5–15.5)

## 2011-02-19 LAB — HEPATIC FUNCTION PANEL
Bilirubin, Direct: 0.4 mg/dL — ABNORMAL HIGH (ref 0.0–0.3)
Indirect Bilirubin: 0.4 mg/dL (ref 0.3–0.9)
Total Bilirubin: 0.8 mg/dL (ref 0.3–1.2)

## 2011-02-19 LAB — BASIC METABOLIC PANEL
CO2: 20 mEq/L (ref 19–32)
Glucose, Bld: 131 mg/dL — ABNORMAL HIGH (ref 70–99)
Potassium: 3.6 mEq/L (ref 3.5–5.1)
Sodium: 131 mEq/L — ABNORMAL LOW (ref 135–145)

## 2011-02-19 LAB — MAGNESIUM: Magnesium: 1.6 mg/dL (ref 1.5–2.5)

## 2011-02-19 LAB — PHOSPHORUS: Phosphorus: 3.8 mg/dL (ref 2.3–4.6)

## 2011-02-20 LAB — COMPREHENSIVE METABOLIC PANEL
ALT: 47 U/L — ABNORMAL HIGH (ref 0–35)
ALT: 56 U/L — ABNORMAL HIGH (ref 0–35)
AST: 43 U/L — ABNORMAL HIGH (ref 0–37)
Albumin: 1.8 g/dL — ABNORMAL LOW (ref 3.5–5.2)
Alkaline Phosphatase: 240 U/L — ABNORMAL HIGH (ref 39–117)
BUN: 97 mg/dL — ABNORMAL HIGH (ref 6–23)
CO2: 24 mEq/L (ref 19–32)
CO2: 26 mEq/L (ref 19–32)
Calcium: 6.4 mg/dL — CL (ref 8.4–10.5)
Creatinine, Ser: 4.11 mg/dL — ABNORMAL HIGH (ref 0.4–1.2)
GFR calc Af Amer: 14 mL/min — ABNORMAL LOW (ref >60)
GFR calc non Af Amer: 11 mL/min — ABNORMAL LOW (ref >60)
GFR calc non Af Amer: 11 mL/min — ABNORMAL LOW (ref >60)
Glucose, Bld: 111 mg/dL — ABNORMAL HIGH (ref 70–99)
Potassium: 4.2 mEq/L (ref 3.5–5.1)
Sodium: 137 mEq/L (ref 135–145)
Sodium: 141 mEq/L (ref 135–145)
Total Bilirubin: 1 mg/dL (ref 0.3–1.2)
Total Protein: 3.8 g/dL — ABNORMAL LOW (ref 6.0–8.3)
Total Protein: 4.4 g/dL — ABNORMAL LOW (ref 6.0–8.3)

## 2011-02-20 LAB — GLUCOSE, CAPILLARY

## 2011-02-20 LAB — URINE CULTURE: Colony Count: 60000

## 2011-02-20 LAB — LIPASE, BLOOD: Lipase: 18 U/L (ref 11–59)

## 2011-02-20 LAB — TSH: TSH: 0.994 u[IU]/mL (ref 0.350–4.500)

## 2011-02-20 LAB — GIARDIA/CRYPTOSPORIDIUM SCREEN(EIA): Cryptosporidium Screen (EIA): NEGATIVE

## 2011-02-20 LAB — URINALYSIS, ROUTINE W REFLEX MICROSCOPIC
Bilirubin Urine: NEGATIVE
Hgb urine dipstick: NEGATIVE
Protein, ur: 30 mg/dL — AB
Urobilinogen, UA: 0.2 mg/dL (ref 0.0–1.0)

## 2011-02-20 LAB — DIFFERENTIAL
Basophils Absolute: 0 10*3/uL (ref 0.0–0.1)
Basophils Relative: 0 % (ref 0–1)
Eosinophils Absolute: 0 10*3/uL (ref 0.0–0.7)
Neutro Abs: 4.3 10*3/uL (ref 1.7–7.7)
Neutrophils Relative %: 75 % (ref 43–77)

## 2011-02-20 LAB — CLOSTRIDIUM DIFFICILE EIA: C difficile Toxins A+B, EIA: NEGATIVE

## 2011-02-20 LAB — IRON AND TIBC
Saturation Ratios: 48 % (ref 20–55)
TIBC: 111 ug/dL — ABNORMAL LOW (ref 250–470)

## 2011-02-20 LAB — FERRITIN: Ferritin: 813 ng/mL — ABNORMAL HIGH (ref 10–291)

## 2011-02-20 LAB — CBC
HCT: 31.2 % — ABNORMAL LOW (ref 36.0–46.0)
Hemoglobin: 10.1 g/dL — ABNORMAL LOW (ref 12.0–15.0)
RBC: 3.47 MIL/uL — ABNORMAL LOW (ref 3.87–5.11)
RDW: 16.9 % — ABNORMAL HIGH (ref 11.5–15.5)

## 2011-02-20 LAB — FECAL LACTOFERRIN, QUANT: Fecal Lactoferrin: POSITIVE

## 2011-02-20 LAB — MAGNESIUM: Magnesium: 1.8 mg/dL (ref 1.5–2.5)

## 2011-02-20 LAB — STOOL CULTURE

## 2011-02-20 LAB — POCT HEMOGLOBIN-HEMACUE: Hemoglobin: 11.3 g/dL — ABNORMAL LOW (ref 12.0–15.0)

## 2011-02-21 LAB — IRON AND TIBC
Iron: 38 ug/dL — ABNORMAL LOW (ref 42–135)
Saturation Ratios: 20 % (ref 20–55)

## 2011-02-21 LAB — POCT HEMOGLOBIN-HEMACUE: Hemoglobin: 11.6 g/dL — ABNORMAL LOW (ref 12.0–15.0)

## 2011-02-22 LAB — IRON AND TIBC
Saturation Ratios: 40 % (ref 20–55)
TIBC: 202 ug/dL — ABNORMAL LOW (ref 250–470)

## 2011-02-22 LAB — POCT HEMOGLOBIN-HEMACUE: Hemoglobin: 11.5 g/dL — ABNORMAL LOW (ref 12.0–15.0)

## 2011-02-23 LAB — IRON AND TIBC: Iron: 59 ug/dL (ref 42–135)

## 2011-02-24 LAB — IRON AND TIBC
Iron: 89 ug/dL (ref 42–135)
Saturation Ratios: 41 % (ref 20–55)
TIBC: 216 ug/dL — ABNORMAL LOW (ref 250–470)
UIBC: 127 ug/dL

## 2011-02-24 LAB — POCT HEMOGLOBIN-HEMACUE: Hemoglobin: 11.9 g/dL — ABNORMAL LOW (ref 12.0–15.0)

## 2011-02-25 LAB — IRON AND TIBC: TIBC: 206 ug/dL — ABNORMAL LOW (ref 250–470)

## 2011-02-26 LAB — IRON AND TIBC
Iron: 72 ug/dL (ref 42–135)
Saturation Ratios: 38 % (ref 20–55)
TIBC: 192 ug/dL — ABNORMAL LOW (ref 250–470)
UIBC: 120 ug/dL

## 2011-02-26 LAB — POCT HEMOGLOBIN-HEMACUE: Hemoglobin: 11.2 g/dL — ABNORMAL LOW (ref 12.0–15.0)

## 2011-02-27 LAB — IRON AND TIBC: Iron: 74 ug/dL (ref 42–135)

## 2011-02-28 ENCOUNTER — Other Ambulatory Visit: Payer: Self-pay | Admitting: Nephrology

## 2011-02-28 ENCOUNTER — Encounter (HOSPITAL_COMMUNITY): Payer: 59 | Attending: Nephrology

## 2011-02-28 DIAGNOSIS — D638 Anemia in other chronic diseases classified elsewhere: Secondary | ICD-10-CM | POA: Insufficient documentation

## 2011-02-28 DIAGNOSIS — N185 Chronic kidney disease, stage 5: Secondary | ICD-10-CM | POA: Insufficient documentation

## 2011-02-28 LAB — POCT HEMOGLOBIN-HEMACUE
Hemoglobin: 10.8 g/dL — ABNORMAL LOW (ref 12.0–15.0)
Hemoglobin: 12.9 g/dL (ref 12.0–15.0)

## 2011-02-28 LAB — IRON AND TIBC
Saturation Ratios: 31 % (ref 20–55)
TIBC: 204 ug/dL — ABNORMAL LOW (ref 250–470)
UIBC: 141 ug/dL

## 2011-02-28 LAB — FERRITIN: Ferritin: 735 ng/mL — ABNORMAL HIGH (ref 10–291)

## 2011-03-05 LAB — IRON AND TIBC
Iron: 56 ug/dL (ref 42–135)
TIBC: 198 ug/dL — ABNORMAL LOW (ref 250–470)
UIBC: 142 ug/dL

## 2011-03-05 LAB — POCT HEMOGLOBIN-HEMACUE: Hemoglobin: 11.4 g/dL — ABNORMAL LOW (ref 12.0–15.0)

## 2011-03-14 ENCOUNTER — Encounter (HOSPITAL_COMMUNITY): Payer: 59

## 2011-03-28 ENCOUNTER — Other Ambulatory Visit: Payer: Self-pay | Admitting: Nephrology

## 2011-03-28 ENCOUNTER — Encounter (HOSPITAL_COMMUNITY): Payer: 59 | Attending: Nephrology

## 2011-03-28 DIAGNOSIS — N185 Chronic kidney disease, stage 5: Secondary | ICD-10-CM | POA: Insufficient documentation

## 2011-03-28 DIAGNOSIS — D638 Anemia in other chronic diseases classified elsewhere: Secondary | ICD-10-CM | POA: Insufficient documentation

## 2011-03-28 LAB — CBC
HCT: 34.6 % — ABNORMAL LOW (ref 36.0–46.0)
MCHC: 31.2 g/dL (ref 30.0–36.0)
Platelets: 192 10*3/uL (ref 150–400)
RDW: 13.6 % (ref 11.5–15.5)
WBC: 8.8 10*3/uL (ref 4.0–10.5)

## 2011-03-28 LAB — IRON AND TIBC
Saturation Ratios: 41 % (ref 20–55)
TIBC: 217 ug/dL — ABNORMAL LOW (ref 250–470)

## 2011-04-02 NOTE — Procedures (Signed)
CEPHALIC VEIN MAPPING   INDICATION:  Preop for AV fistula placement.   HISTORY:  Chronic kidney disease.   EXAM:  The right cephalic vein is compressible with diameter measurements  ranging from 0.15 to 0.27 cm.   The right basilic vein is compressible with diameter measurements  ranging from 0.19 to 0.22 cm.   The left cephalic vein is compressible with diameter measurements  ranging from 0.13 to 0.22 cm.   The left basilic vein is compressible with diameter measurements ranging  from 0.22 to 0.34 cm.   See attached worksheet for all measurements.   IMPRESSION:  Patent bilateral cephalic and basilic veins with diameter  measurements as described above.   ___________________________________________  Leonides Sake, MD   CH/MEDQ  D:  09/03/2010  T:  09/03/2010  Job:  [id number]

## 2011-04-02 NOTE — Procedures (Signed)
DUPLEX DEEP VENOUS EXAM - LOWER EXTREMITY   INDICATION:  Bilateral lower extremity edema.   HISTORY:  Edema:  Yes.  Trauma/Surgery:  No.  Pain:  Yes.  PE:  No.  Previous DVT:  No.  Anticoagulants:  Other:   DUPLEX EXAM:                CFV   SFV   PopV  PTV    GSV                R  L  R  L  R  L  R   L  R  L  Thrombosis    +  +  +  +  +  +  +   +  +  +  Spontaneous   o  o  o  o  o  o  o   o  o  o  Phasic        o  o  o  o  o  o  o   o  o  o  Augmentation  o  o  o  o  o  o  o   o  o  o  Compressible  o  o  o  o  o  o  o   o  o  o  Competent     o  o  o  o  o  o  o   o  o  o   Legend:  + - yes  o - no  p - partial  D - decreased   IMPRESSION:  Acute deep venous thrombosis noted in bilateral  saphenofemoral junctions, common femoral veins, superficial femoral,  deep femoral, popliteal, and left posterior tibial and peroneal veins.   Notified Pam at Dr. Deterding's office with results.    _____________________________  V. Charlena Cross, MD   MG/MEDQ  D:  03/05/2010  T:  03/05/2010  Job:  [id number]

## 2011-04-02 NOTE — Assessment & Plan Note (Signed)
OFFICE VISIT   SORA, VROOMAN  DOB:  March 06, 1956                                       08/31/2010  JYNWG#:[id number]   NEW PATIENT CONSULTATION   REASON FOR REFERRAL:  New access.   HISTORY OF PRESENT ILLNESS:  This is an area of 55 year old patient with  lupus who presents with chief complaint, need for the access.  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  Unfortunately, her  kidney disease is progressing and her nephrologist feels that she needs  permanent access placement for dialysis in the next few months.  The  exact time frame, the patient is not aware of at this point.  She notes  she is right-hand dominant and never has had any previous types of  accesses.  She has had no central venous catheterizations and  denies  any sensory or motor deficits.   PAST MEDICAL HISTORY:  1. Lupus with nephritis.  2. GI bleed.  3. Obstructive sleep apnea.  4. Morbid obesity.  5. History of deep vein thrombosis.  6. Tachycardia .  7. Gout.  8. Anemia.  9. Secondary hypoparathyroidism.  10.Hyperlipidemia.  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   PAST SURGICAL HISTORY:  She had a laparoscopic gastric bypass and  inferior vena cava filter placements.   SOCIAL HISTORY:  She is an active smoker of greater than 20 pack-year  history.  Denies any alcohol or drug use.   FAMILY HISTORY:  The sister has lupus.  Is not certain of any medical  problems with her mother or father.   MEDICATIONS:  CellCept, sodium bicarbonate, prednisone, Hectorol,  allopurinol, Lasix, metoprolol, vitamin B12, glucosamine chondroitin,  Flexeril.   ALLERGIES:  She has no known drug allergies.   REVIEW OF SYSTEMS:  She noted dizziness, blood clots, and vein  arteritis, joint pain, muscle pain, palpitations, anemia,  kidney  disease.  Otherwise, the rest of her 12-point review of systems was  noted to be negative.   PHYSICAL EXAMINATION:  VITALS: She had a temperature of 98.2, blood  pressure of 117/74, heart rate of 74, respirations were 12.  GENERAL EXAMINATION:  Alert and oriented x3, well developed, well  nourished, no apparent distress.  HEAD EXAM:  Normocephalic, atraumatic.  ENT:  Hearing grossly intact.  Nares without any erythema or drainage.  OROPHARYNX:  Without erythema or exudate.  EYE EXAM:  Pupils were equal, round, reactive to light.  Extraocular  movements were intact.  NECK EXAM:  Supple with no nuchal rigidity.  No palpable  lymphadenopathy.  PULMONARY:  Symmetric expansion.  Good air movement.  Clear to  auscultation bilaterally.  No rales, rhonchi, or wheezing.  CARDIAC EXAM:  Regular rate and rhythm.  Normal S1-S2.  No murmurs or  rubs, thrills or gallops.  VASCULAR EXAM:  There are palpable radial, brachial pulses and carotid  pulses bilaterally.  There are no carotid bruits.  Aorta was not  palpable.  Femoral, popliteal, and pedal pulses are palpable  bilaterally.  GI EXAM:  Soft abdomen.  Nontender, nondistended.  No guarding, no  rebound, no hepatosplenomegaly, no obvious masses on palpation.  MUSCULOSKELETAL EXAM:  All extremities have 5/5 strength.  There are no  wounds on any extremity.  NEURO EXAM:  Cranial nerves II-XII were intact.  Motor exam was as  above.  The sensation was intact in all extremities.  PSYCH EXAM:  Judgment intact.  Mood and affect were appropriate for her  clinical situation.  SKIN EXAM/MUSCULOSKELETAL EXAM:  No lesions on any of the extremities.  In addition, there was no malar rash or any other type of rash present  on the patient's skin  LYMPHATIC EXAM:  No cervical, axillary, or inguinal lymphadenopathy.   NONINVASIVE VASCULAR IMAGING:  She had bilateral upper extremity vein  mapping completed and the right arm demonstrates no adequate  veins.  The  non-dominant arm cephalic vein is inadequate.  The basilic vein in the  upper arm ranges from 0.26 up to 0.34 cm in diameter.   MEDICAL DECISION MAKING.:  The patient is a 55 year old patient rapidly  approaching end-stage renal disease requiring hemodialysis.  Based on  her vein mapping, her best chance for a fistula would be a left arm  staged basilic vein transposition.  There is no way a single-stage  basilic vein transposition can be completed in this patient  successfully.  We had a long extensive discussion, approximately 10  minutes, of risks of access  surgery including not limited to nerve  damage, steal, ischemic monomelic neuropathy, failure to mature, and the  need for additional operations.  She understands and would like to do  some additional research but she tentatively agreed to be tentatively  scheduled for September 24, 2010, for a first stage left basilic vein  transposition.     Leonides Sake, MD  Electronically Signed   BC/MEDQ  D:  08/31/2010  T:  09/03/2010  Job:  [id number]

## 2011-04-02 NOTE — Op Note (Signed)
[name], [name]              ACCOUNT NO.:  [id number]   MEDICAL RECORD NO.:  [id number]          PATIENT TYPE:  INP   LOCATION:  0001                         FACILITY:  [facility]   PHYSICIAN:  Thornton Park. Daphine Deutscher, MD  DATE OF BIRTH:  [birth year]   DATE OF PROCEDURE:  05/23/2008  DATE OF DISCHARGE:                               OPERATIVE REPORT   PREOPERATIVE DIAGNOSES:  1. Lupus erythematosus with lupus nephritis.  2. Arthritis.  3. Hypertension.  4. Dyslipidemia.   PROCEDURE:  1. Laparoscopic Roux-en-Y gastric bypass (44 cm BP limb, 110 cm Roux      limb, anticolic, antegastric candy cane to the left with closure of      Peterson's on the left).  2. Endoscopy.  3. Lysis of adhesions (20 minutes).   SURGEON:  Thornton Park. Daphine Deutscher, MD.   ASSISTANT:  Alfonse Ras, MD.   DESCRIPTION OF PROCEDURE:  [name] is a 55 year old African  American lady with morbid obesity and BMI of 63.  She has had some lupus  nephritis for which she is followed by Dr. Darrick Penna and informed  consent was obtained regarding Roux-en-Y gastric bypass and not only its  attendant risk but her increased risk because of her use of steroids and  her underlying autoimmune disease.  The patient was taken to room #1 on  Monday, May 23, 2008, and given general anesthesia.  The abdomen was  prepped with Techni-Care and draped sterilely.  Access to the abdomen  was achieved through the left upper quadrant using a 12 mm OptiVu  technique without difficulty.  The abdomen was insufflated and I entered  the abdomen without difficulty.  She had some loops of bowel stuck to  her midline incision from her hysterectomy and after I placed a trocar  in the right upper quadrant, I used scissors to sharply take these down.  This was done without incident and complete lysis of these adhesions was  performed.   The standard trocar placements were used and I ultimately ended up  adding an extra more superior trocar to  the left of the umbilicus for  the camera and also went up higher on the right with a third trocar to  the right of the midline as an operative port and Dr. Colin Benton used two  ports, the 12 and 5 on the left, for the retraction; plus the 5 mm upper  midline for the liver retractor.   First the omentum was retracted, enabling me to expose the ligament of  Treitz.  I then walked 44 cm downstream from the ligament of Treitz and  divided the bowel with the Covidien 60 mm stapler.  A Penrose drain was  sewn to the distal end which is the Roux limb.  I then measured 110 cm  upstream and then sutured the bowel in a side-to-side fashion.  I opened  along the antimesenteric border, inserted a 45 cm white cartridge  endoscopic stapler firing this.  No bleeding was noted.  The common  defect was closed from either end with 2-0 Vicryl.  In the middle, there  was an area that I reinforced with a U stitch of 2-0 Vicryl.  A 2-0 silk  was used to approximate in the mesenteric defect.  Tisseel was applied.   Next, we divided the omentum.  A 5 mm was placed and the liver retractor  inserted.  I measured about 4-5 cm along the lesser curve and then went  in and got into the lesser sac.  I fired across the stomach with a  single firing of the Covidien blue staple load and then about three more  staples up to the cardia.  In the meantime, we used the [name] tube to  calibrate this outlet.   Once this was done, I brought the Roux end up, removed the Penrose,  sutured a back row with running 2-0 Vicryl and Lapra-Ty ties.  I opened  along the stomach and using the [name] tube as a backstop and then opened  along the Roux limb antimesenteric border.  A 45 stapler was placed from  the upper stapler into either limb of the small bowel and stomach and  fired.  The common defect was closed from either end with 2-0 Vicryl and  then the entire suture line was oversewn with a running 2-0 Vicryl using  a regular SH needle.   The [name] tube was placed across the anastomosis  to stent it during this closure.   At the completion, Dr. Colin Benton endoscoped the patient and looked at the  pouch which showed no evidence of bleeding and she was able to get  easily across the anastomosis and the pouch measured about 4-5 cm in  length.  No bleeding.  On the laparoscopic view, there were no bubbles  and no evidence of leak.  Everything looked to be in order.   The irrigation was withdrawn.  The Tisseel was applied to the  gastrojejunostomy.  I had previously applied some Tisseel up along the  stapled proximal part of the pouch as well as to the remnant.  The  patient tolerated this procedure well.  At the end, I deflated the  abdomen and closed all the wounds with 4-0 Vicryl and with staples as  well.  These, hopefully, should be able to be taken out prior to  discharge.  The patient tolerated the procedure well and was taken to  the recovery room in satisfactory condition.      Thornton Park Daphine Deutscher, MD  Electronically Signed     MBM/MEDQ  D:  05/23/2008  T:  05/23/2008  Job:  [id number]   cc:   Stacie Acres. Cliffton Asters, M.D.  Fax: [phone]   Llana Aliment. Deterding, M.D.  Fax: [phone]

## 2011-04-02 NOTE — Assessment & Plan Note (Signed)
OFFICE VISIT   [name], [name]  DOB:  1956/04/19                                       10/26/2010  ZOXWR#:[id number]   Date of surgery was 09/25/2010.  The patient is a 55 year old African  American female with lupus nephritis that is rapidly approaching end-  stage renal disease.  However, she is not on dialysis yet.  Vein mapping  in her nondominant arm only demonstrated a marginal basilic vein that  was felt could possibly be amenable to a staged basilic vein  transposition.  The patient was taken to the OR on 09/24/2010 for a left  first stage basilic vein transposition.  She presents for her first  followup status post procedure.  The patient has felt well and has had  no complications or issues since surgery.  She denies any type of steal  symptoms.   Heart rate 98, blood pressure 122/81, O2 saturation 98% on room air.  The patient has good sensation and grip strength in the bilateral upper  extremities.  She has a 2+ radial and ulnar pulse in the left wrist.  There is no thrill or bruit noted over the basilic vein transposition.  The incision is well-healed.   ASSESSMENT:  Failed first stage left basilic vein transposition.   PLAN:  This was the only viable option for a fistula in this patient.  Dr. Darrick Penna was called and spoken to today regarding possible  estimated date of need for dialysis.  His only estimate is within the  next 6 months to a year.  The patient's need for dialysis in the future  will require a left forearm versus upper arm AV Gore-Tex graft.  The  patient will need to follow with Dr. Imogene Burn within the next 2 months for  re-evaluation.  The patient is instructed that if her renal status  deteriorates over that time period she or Dr. Darrick Penna is to contact  our office and she will certainly be seen and evaluated more quickly.  All of this was discussed with the patient at length and she voiced  understanding.   Pecola Leisure, [place]   Leonides Sake, MD  Electronically Signed   AY/MEDQ  D:  10/26/2010  T:  10/26/2010  Job:  [id number]

## 2011-04-02 NOTE — Discharge Summary (Signed)
NAMEJONITA, [name]              ACCOUNT NO.:  [id number]   MEDICAL RECORD NO.:  [id number]          PATIENT TYPE:  INP   LOCATION:  1525                         [facility]ACILITY:  [facility]   PHYSICIAN:  Thornton Park. Daphine Deutscher, MD  DATE OF BIRTH:  [birth year]   DATE OF ADMISSION:  05/23/2008  DATE OF DISCHARGE:  05/26/2008                               DISCHARGE SUMMARY   ADMITTING DIAGNOSIS:  Morbid obesity with chronic renal insufficiency  because of lupus erythematosus nephritis.   PROCEDURE:  Laparoscopic Roux-en-Y gastric bypass.   COURSE IN THE HOSPITAL:  [name] is a 52-year lady with lupus  nephritis and a BMI of 63 who underwent a Roux-en-Y gastric bypass on  May 23, 2008.  Postoperatively she had a swallow that showed no leak and  it emptied well subsequently.  She was begun on liquids and advanced as  bariatric patient's usually are with his operation and to be ready for  discharge on postoperative day #3.  Labs were good and her creatinine  stayed pretty stable.  She was given prescription for Roxicet and an  appointment office in 1 week.      Thornton Park Daphine Deutscher, MD  Electronically Signed     MBM/MEDQ  D:  06/13/2008  T:  06/13/2008  Job:  [id number]

## 2011-04-11 ENCOUNTER — Other Ambulatory Visit: Payer: Self-pay | Admitting: Nephrology

## 2011-04-11 ENCOUNTER — Encounter (HOSPITAL_COMMUNITY): Payer: 59

## 2011-04-11 LAB — POCT HEMOGLOBIN-HEMACUE: Hemoglobin: 10.5 g/dL — ABNORMAL LOW (ref 12.0–15.0)

## 2011-04-22 ENCOUNTER — Encounter (INDEPENDENT_AMBULATORY_CARE_PROVIDER_SITE_OTHER): Payer: 59

## 2011-04-22 DIAGNOSIS — I82409 Acute embolism and thrombosis of unspecified deep veins of unspecified lower extremity: Secondary | ICD-10-CM

## 2011-04-22 DIAGNOSIS — Z86718 Personal history of other venous thrombosis and embolism: Secondary | ICD-10-CM

## 2011-04-24 ENCOUNTER — Encounter (HOSPITAL_COMMUNITY)
Admission: RE | Admit: 2011-04-24 | Discharge: 2011-04-24 | Disposition: A | Discharge status: Home / Self Care | Payer: 59 | Source: Ambulatory Visit | Attending: Vascular Surgery | Admitting: Vascular Surgery

## 2011-04-24 LAB — SURGICAL PCR SCREEN: Staphylococcus aureus: NEGATIVE

## 2011-04-25 ENCOUNTER — Encounter (HOSPITAL_COMMUNITY): Payer: 59

## 2011-04-26 NOTE — Procedures (Unsigned)
DUPLEX DEEP VENOUS EXAM - LOWER EXTREMITY  INDICATION:  Followup venous thrombosis.  HISTORY:  Edema:  Yes. Trauma/Surgery:  No/surgery IVC filter 02/2010. Pain:  No. PE:  No. Previous DVT:  Yes. Anticoagulants:  No. Other:  DUPLEX EXAM:               CFV   SFV   PopV   PTV   GSV               R  L  R  L  R  L   R  L  R  L Thrombosis    P  o  P  P  P  P   P  P  o  o Spontaneous   +  +  +  +  +  +   +  +  +  + Phasic        +  +  +  +  +  +   +  +  +  + Augmentation  +  +  D  D  D  D   +  +  +  + Compressible  P  +  P  P  P  P   P  P  +  + Competent     o  o  o  o  o  o   o  o  o  o  Legend:  + - yes  o - no  p - partial  D - decreased  IMPRESSION: 1. Chronic deep vein thrombosis present involving the bilateral distal     external iliac, right common femoral, bilateral superficial     femoral, bilateral popliteal, bilateral posterior tibial, bilateral     peroneal veins, and left profunda femoral vein. 2. Bilateral great saphenous veins appear patent at saphenofemoral     junctions with normal spontaneous flow and reflux present. 3. Venous reflux present throughout the bilateral deep venous systems. 4. Improvement in the disease process since previous study 03/05/2010.     _____________________________ Fransisco Hertz, MD  SH/MEDQ  D:  04/22/2011  T:  04/22/2011  Job:  [id number]

## 2011-05-02 ENCOUNTER — Other Ambulatory Visit: Payer: Self-pay | Admitting: Nephrology

## 2011-05-02 ENCOUNTER — Encounter (HOSPITAL_COMMUNITY): Payer: 59 | Attending: Nephrology

## 2011-05-02 ENCOUNTER — Encounter (HOSPITAL_COMMUNITY): Payer: 59

## 2011-05-02 DIAGNOSIS — D638 Anemia in other chronic diseases classified elsewhere: Secondary | ICD-10-CM | POA: Insufficient documentation

## 2011-05-02 DIAGNOSIS — N185 Chronic kidney disease, stage 5: Secondary | ICD-10-CM | POA: Insufficient documentation

## 2011-05-02 LAB — RENAL FUNCTION PANEL
Albumin: 3.5 g/dL (ref 3.5–5.2)
BUN: 85 mg/dL — ABNORMAL HIGH (ref 6–23)
Chloride: 97 mEq/L (ref 96–112)
GFR calc Af Amer: 11 mL/min — ABNORMAL LOW (ref >60)
Glucose, Bld: 90 mg/dL (ref 70–99)
Phosphorus: 6.3 mg/dL — ABNORMAL HIGH (ref 2.3–4.6)
Potassium: 3.5 mEq/L (ref 3.5–5.1)
Sodium: 140 mEq/L (ref 135–145)

## 2011-05-02 LAB — IRON AND TIBC: Iron: 103 ug/dL (ref 42–135)

## 2011-05-02 LAB — POCT HEMOGLOBIN-HEMACUE: Hemoglobin: 12.2 g/dL (ref 12.0–15.0)

## 2011-05-06 ENCOUNTER — Ambulatory Visit (HOSPITAL_COMMUNITY)
Admission: RE | Admit: 2011-05-06 | Discharge: 2011-05-06 | Disposition: A | Discharge status: Home / Self Care | Payer: 59 | Source: Ambulatory Visit | Attending: Vascular Surgery | Admitting: Vascular Surgery

## 2011-05-06 DIAGNOSIS — I129 Hypertensive chronic kidney disease with stage 1 through stage 4 chronic kidney disease, or unspecified chronic kidney disease: Secondary | ICD-10-CM | POA: Insufficient documentation

## 2011-05-06 DIAGNOSIS — Z01812 Encounter for preprocedural laboratory examination: Secondary | ICD-10-CM | POA: Insufficient documentation

## 2011-05-06 DIAGNOSIS — N189 Chronic kidney disease, unspecified: Secondary | ICD-10-CM | POA: Insufficient documentation

## 2011-05-06 DIAGNOSIS — IMO0002 Reserved for concepts with insufficient information to code with codable children: Secondary | ICD-10-CM | POA: Insufficient documentation

## 2011-05-06 DIAGNOSIS — N186 End stage renal disease: Secondary | ICD-10-CM

## 2011-05-06 DIAGNOSIS — M329 Systemic lupus erythematosus, unspecified: Secondary | ICD-10-CM | POA: Insufficient documentation

## 2011-05-06 DIAGNOSIS — I120 Hypertensive chronic kidney disease with stage 5 chronic kidney disease or end stage renal disease: Secondary | ICD-10-CM

## 2011-05-06 LAB — POCT I-STAT 4, (NA,K, GLUC, HGB,HCT)
Glucose, Bld: 94 mg/dL (ref 70–99)
HCT: 31 % — ABNORMAL LOW (ref 36.0–46.0)
Hemoglobin: 10.5 g/dL — ABNORMAL LOW (ref 12.0–15.0)

## 2011-05-09 NOTE — Op Note (Signed)
NAMELASHALA, LASER NO.:  [id number]  MEDICAL RECORD NO.:  [id number]  LOCATION:  SDSC                         FACILITY:  [facility]  PHYSICIAN:  Fransisco Hertz, MD       DATE OF BIRTH:  Jun 18, 1956  DATE OF PROCEDURE:  05/06/2011 DATE OF DISCHARGE:  05/06/2011                              OPERATIVE REPORT   PROCEDURE:  Left arm arteriovenous graft placement in the forearm loop configuration.  PREOPERATIVE DIAGNOSES: 1. Chronic kidney disease. 2. Imminent end-stage renal disease.  POSTOPERATIVE DIAGNOSES: 1. Chronic kidney disease. 2. Imminent end-stage renal disease.  SURGEON:  Fransisco Hertz, MD  ASSISTANT:  Della Goo, [facility]  ANESTHESIA:  Monitored anesthesia care and local.  FINDINGS:  Included a small brachial artery that was about 2.5-mm diameter externally and a brachial vein that was about 4 mm.  There was also a weak thrill in the venous outflow at the end of the case and dopplerable radial signal.  SPECIMENS:  None.  ESTIMATED BLOOD LOSS:  Minimal.  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 Previously, I had placed a left basilic vein transposition in the left arm.  Unfortunately, this failed to mature.  She then went greater than 6 months prior to being sent back for placement of her graft as now she is proceeding into imminent end-stage renal disease.  She is aware of the risks of this procedure include bleeding, infection, steal syndrome, ischemic monomelic neuropathy, possible failure to mature, and possible need for additional procedures.  She was aware of these risks and agreed to proceed forward.  DESCRIPTION OF OPERATION:  After full informed written consent was obtained from the patient, she was brought back to the operating room, placed supine upon the operating table.  Prior to induction, she had received IV antibiotics.  She was then prepped and draped in standard fashion for a left arm access procedure  after obtaining adequate sedation.  We turned our attention to the left antecubital.  I injected about 10 mL of 1:1 mix of 0.5% Marcaine without epinephrine and 1% lidocaine with epinephrine to obtain anesthesia in the area immediately above the brachial artery.  We made a longitudinal incision over the brachial artery, dissected down to the brachial artery with no difficulties.  I was able to dissect out the brachial artery.  The previous fistula was sclerotic, here I placed a titanium clip on the anastomosis and then transected it.  I then dissected out this artery proximally and distally.  It appeared to be externally 2.5 mm.  I looked down the arm and based on my intraoperative ultrasound, I do not see evidence that this is a high bifurcation, so this likely represents just a small brachial artery.  There was a reasonable pulse within it but as this case proceeded, there  was evidence that there was some spasm in the artery.  At this point, I dissected in the surround tissue and was able to identify 2 brachial veins. The more medial vein appeared to be too small for use, however, the more lateral vein appeared to be dilated up to 4 mm externally.  I dissected this out proximally and  distally and indeed it did appear to be at least 5 mm externally.  I tied off the vein distally and transected it and then spatulated the vein.  I then was able to pass easily a 4-mm dilator with room to spare.  At this point, I injected heparinized saline at this vein and clamped it with a serrefine.  Plan was to use this as a venous outflow and the forearm loop graft.  I then marked out the pathway for a loop on this patient's forearm.  I obtained a 4 mm x 7 mm stretch Goretex graft and stretched it out, using it to help identify the apex of this graft tunnel.  Then,  I anesthetized the entire tract for this forearm loop with the previously  noted anesthetic mixture.  A total about 60 mL total throughout  this case  was used.  I then tunneled from the arm to the apical incision and passed the graft through the metal tunnel.  Then I dissected from the antecubital incision to apical incision, and finished delivering the graft.  The  orientation of the graft was maintained while passing the graft.  The 4 mm end was on the ulnar side and 7 mm end was on the radial side.  Then, I gave the patient 3000 units of heparin and waited 3 minutes.  I placed the brachial artery under tension proximally and distally with vessel loops, and made an arteriotomy and extended this with a Potts scissor.  Note that the artery itself looked a little bit atherosclerotic but was intact and not calcified so I felt it was worth an attempt at a graft.  I spatulated the 4-mm end of this graft to about 4.5 mm, sewed this graft to the artery in an end-to-side configuration with a 7-0 Prolene.  After completing this part of the case, I allowed the graft to decompress. There was good antegrade bleeding, not particularly pulsatile, however, this was actually consistent with the baseline blood flow in this brachial artery  I had after immediately performing arteriotomy.  I had allowed the artery to bleed in antegrade fashion and there was continuous antegrade bleeding, but not particularly pulsatile and at this point, then I clamped the graft at the apex and then I irrigated out the graft with heparinized saline, removing any residual blood within it.  At this point, I turned my attention to the venous end of this graft.  I adjusted the tension on the graft and cut it to appropriate length, spatulating it to meet the dimensions for an end-to- end anastomosis.  The vein and graft were sewed with a 6-0 Prolene in an end-to-end configuration.  There was a good match between the vein and the graft.  Prior to completing anastomosis, I allowed the graft to bleed in antegrade fashion.  Blood flow once again was continuous, but was  not particularly pulsatile.  I completed the anastomosis in the usual fashion and then released all clamps.  Immediately, the vein did distend up.  I could feel pulse in the venous outflow, but not a particularly strong thrill.  My suspicion is the artery is somewhat spasmed as, I do not even feel a pulse in the artery at this point.  I checked for radial pulse.  There was no radial pulse, however, there was a dopplerable signal and then I also dopplered the brachial artery, proximal and distal to this, which were multiphasic.  The venous outflow demonstrated widely patent arteriovenous graft blood flow.  At this point, I irrigated out the wound.  There was no more active bleeding.  The apical incision was then repaired with a double layer of 3-0 Vicryl, tied in the deep tissue and also in the subcuticular tissue.  The antecubital incision was repaired with a layer of 3-0 Vicryl in a running fashion and then the skin was reapproximated with running subcuticular 4-0 Monocryl.  The skin was cleaned, dried, and Dermabond was placed at both sets of incisions.  At this point, the patient was allowed to awaken without difficulties with a plan to discharge home.  COMPLICATIONS:  None.  CONDITION:  Stable.     Fransisco Hertz, MD     BLC/MEDQ  D:  05/06/2011  T:  05/07/2011  Job:  [id number]  Electronically Signed by Leonides Sake MD on 05/09/2011 12:52:42 PM

## 2011-05-14 ENCOUNTER — Ambulatory Visit (INDEPENDENT_AMBULATORY_CARE_PROVIDER_SITE_OTHER): Payer: 59 | Admitting: Vascular Surgery

## 2011-05-14 DIAGNOSIS — N186 End stage renal disease: Secondary | ICD-10-CM

## 2011-05-15 NOTE — Assessment & Plan Note (Addendum)
OFFICE VISIT  [name], [name] DOB:  05/29/1956                                       05/14/2011 NUUVO#:[id number]  The patient had insertion of left forearm AV graft by Dr. Imogene Burn on June 18th for end-stage renal disease.  She is not yet on hemodialysis.  She had previously had an attempt at a basilic vein transposition which did not mature.  She has had moderate swelling in the left arm since the surgery.  She denies any pain or numbness in the hand.  On examination, she has a blood pressure of 97/68, heart rate 87, respiratory rate 14.  The left forearm and hand have 2+ edema with decreasing edema as one progresses towards the axilla.  There is no erythema surrounding the graft and the graft has a pulse and palpable thrill.  Radial pulse is 1+ and the hand is warm and well-perfused.  I have encouraged her to elevate the arm as much as possible on 3 or 4 pillows at night and also during the day intermittently.  She is not having symptoms consistent with steal and she may have a proximal venous obstruction or stenosis causing the edema, or it  may resolve with time. She is scheduled to see Dr. Irineo Axon in followup in 2 weeks.  If  her edema does not improve or resolve, she will need a shuntogram with evaluation of the venous runoff at some point.    Quita Skye Hart [place], M.D. Electronically Signed  JDL/MEDQ  D:  05/14/2011  T:  05/15/2011  Job:  [id number]

## 2011-05-16 ENCOUNTER — Encounter (HOSPITAL_COMMUNITY): Payer: 59

## 2011-05-16 ENCOUNTER — Other Ambulatory Visit: Payer: Self-pay | Admitting: Nephrology

## 2011-05-23 ENCOUNTER — Encounter: Payer: Self-pay | Admitting: Vascular Surgery

## 2011-05-24 ENCOUNTER — Inpatient Hospital Stay (HOSPITAL_COMMUNITY)
Admission: EM | Admit: 2011-05-24 | Discharge: 2011-05-29 | DRG: 300 | Disposition: A | Discharge status: Home / Self Care | Payer: 59 | Attending: Internal Medicine | Admitting: Internal Medicine

## 2011-05-24 DIAGNOSIS — M109 Gout, unspecified: Secondary | ICD-10-CM | POA: Diagnosis present

## 2011-05-24 DIAGNOSIS — N039 Chronic nephritic syndrome with unspecified morphologic changes: Secondary | ICD-10-CM | POA: Diagnosis present

## 2011-05-24 DIAGNOSIS — N185 Chronic kidney disease, stage 5: Secondary | ICD-10-CM | POA: Diagnosis present

## 2011-05-24 DIAGNOSIS — M329 Systemic lupus erythematosus, unspecified: Secondary | ICD-10-CM | POA: Diagnosis present

## 2011-05-24 DIAGNOSIS — I120 Hypertensive chronic kidney disease with stage 5 chronic kidney disease or end stage renal disease: Secondary | ICD-10-CM | POA: Diagnosis present

## 2011-05-24 DIAGNOSIS — K219 Gastro-esophageal reflux disease without esophagitis: Secondary | ICD-10-CM | POA: Diagnosis present

## 2011-05-24 DIAGNOSIS — D638 Anemia in other chronic diseases classified elsewhere: Secondary | ICD-10-CM | POA: Diagnosis present

## 2011-05-24 DIAGNOSIS — N2581 Secondary hyperparathyroidism of renal origin: Secondary | ICD-10-CM | POA: Diagnosis present

## 2011-05-24 DIAGNOSIS — E669 Obesity, unspecified: Secondary | ICD-10-CM | POA: Diagnosis present

## 2011-05-24 DIAGNOSIS — I82B19 Acute embolism and thrombosis of unspecified subclavian vein: Principal | ICD-10-CM | POA: Diagnosis present

## 2011-05-24 IMAGING — NM NM PULM PERFUSION & VENT (REBREATHING & WASHOUT)
2 series · 12 of 12 positions shown · non-contrast
Comparison: Chest radiograph of 03/06/2010

CLINICAL DATA: Bilateral lower extremity DVT.  Renal
insufficiency.

NUCLEAR MEDICINE VENTILATION - PERFUSION LUNG SCAN
TECHNIQUE: Wash-in, equilibrium, and wash-out phase ventilation
images were obtained using 7e-7DD gas.  Perfusion images were
obtained in multiple projections after intravenous injection of Tc-
99m MAA.
Radiopharmaceuticals:  11.0 mCi 7e-7DD gas and 6.5 mCi 3c-44m MAA.

[vq scan · 2.52mm/px · 6 of 20 frames shown (1 of 2)]
[frame 2/20  full-range]
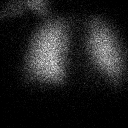
[frame 5/20  full-range]
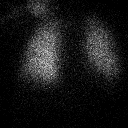
[frame 9/20  full-range]
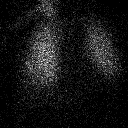
[frame 12/20]
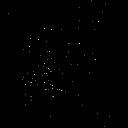
[frame 15/20]
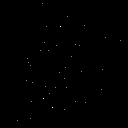
[frame 19/20]
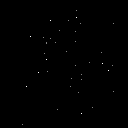

[vq scan · 2.52mm/px · 6 of 20 frames shown (2 of 2)]
[frame 2/20  full-range]
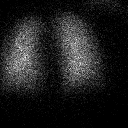
[frame 5/20  full-range]
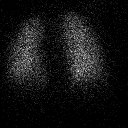
[frame 9/20  full-range]
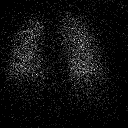
[frame 12/20]
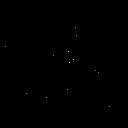
[frame 15/20]
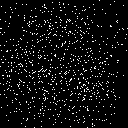
[frame 19/20]
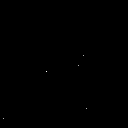

[12 of 12 positions shown; findings below may reference images not displayed]

FINDINGS: There is very minimal air trapping on the washout images.

No significant perfusion defect is identified on perfusion images
to suggest pulmonary embolus.
IMPRESSION: 1.  Very minimal air trapping in the lungs, possibly due to mild
COPD.
2.  No perfusion defect is identified to suggest pulmonary embolus.

## 2011-05-25 ENCOUNTER — Emergency Department (HOSPITAL_COMMUNITY): Payer: 59

## 2011-05-25 DIAGNOSIS — I808 Phlebitis and thrombophlebitis of other sites: Secondary | ICD-10-CM

## 2011-05-25 LAB — BASIC METABOLIC PANEL
BUN: 93 mg/dL — ABNORMAL HIGH (ref 6–23)
Calcium: 9 mg/dL (ref 8.4–10.5)
GFR calc Af Amer: 9 mL/min — ABNORMAL LOW (ref >60)
GFR calc non Af Amer: 7 mL/min — ABNORMAL LOW (ref >60)
Glucose, Bld: 97 mg/dL (ref 70–99)
Potassium: 4.2 mEq/L (ref 3.5–5.1)
Sodium: 138 mEq/L (ref 135–145)

## 2011-05-25 LAB — PROTIME-INR
INR: 1.08 (ref 0.00–1.49)
Prothrombin Time: 14.2 seconds (ref 11.6–15.2)

## 2011-05-25 LAB — HEPARIN LEVEL (UNFRACTIONATED): Heparin Unfractionated: 1.6 IU/mL — ABNORMAL HIGH (ref 0.30–0.70)

## 2011-05-25 LAB — COMPREHENSIVE METABOLIC PANEL
AST: 12 U/L (ref 0–37)
Albumin: 3.2 g/dL — ABNORMAL LOW (ref 3.5–5.2)
BUN: 93 mg/dL — ABNORMAL HIGH (ref 6–23)
Calcium: 8.7 mg/dL (ref 8.4–10.5)
Creatinine, Ser: 5.94 mg/dL — ABNORMAL HIGH (ref 0.50–1.10)
Total Protein: 5.8 g/dL — ABNORMAL LOW (ref 6.0–8.3)

## 2011-05-25 LAB — DIFFERENTIAL
Basophils Absolute: 0 10*3/uL (ref 0.0–0.1)
Basophils Relative: 0 % (ref 0–1)
Eosinophils Relative: 1 % (ref 0–5)
Lymphocytes Relative: 37 % (ref 12–46)
Monocytes Absolute: 0.7 10*3/uL (ref 0.1–1.0)
Neutro Abs: 6.6 10*3/uL (ref 1.7–7.7)

## 2011-05-25 LAB — CBC
MCHC: 30.9 g/dL (ref 30.0–36.0)
RDW: 15 % (ref 11.5–15.5)
WBC: 11.8 10*3/uL — ABNORMAL HIGH (ref 4.0–10.5)

## 2011-05-25 LAB — TSH: TSH: 2.235 u[IU]/mL (ref 0.350–4.500)

## 2011-05-25 LAB — APTT: aPTT: 28 seconds (ref 24–37)

## 2011-05-26 LAB — RENAL FUNCTION PANEL
Albumin: 2.9 g/dL — ABNORMAL LOW (ref 3.5–5.2)
Calcium: 8.2 mg/dL — ABNORMAL LOW (ref 8.4–10.5)
GFR calc Af Amer: 9 mL/min — ABNORMAL LOW (ref >60)
Glucose, Bld: 98 mg/dL (ref 70–99)
Phosphorus: 7.1 mg/dL — ABNORMAL HIGH (ref 2.3–4.6)
Sodium: 138 mEq/L (ref 135–145)

## 2011-05-26 LAB — CBC
HCT: 24.5 % — ABNORMAL LOW (ref 36.0–46.0)
MCHC: 31.8 g/dL (ref 30.0–36.0)
Platelets: 245 10*3/uL (ref 150–400)
RDW: 14.8 % (ref 11.5–15.5)

## 2011-05-26 LAB — PROTIME-INR
INR: 1.16 (ref 0.00–1.49)
Prothrombin Time: 15 seconds (ref 11.6–15.2)

## 2011-05-26 LAB — POCT OCCULT BLOOD STOOL (DEVICE): Fecal Occult Bld: NEGATIVE

## 2011-05-27 LAB — PROTIME-INR
INR: 1.07 (ref 0.00–1.49)
Prothrombin Time: 14.1 seconds (ref 11.6–15.2)

## 2011-05-27 LAB — RENAL FUNCTION PANEL
Albumin: 2.7 g/dL — ABNORMAL LOW (ref 3.5–5.2)
Chloride: 97 mEq/L (ref 96–112)
Creatinine, Ser: 5.89 mg/dL — ABNORMAL HIGH (ref 0.50–1.10)
GFR calc non Af Amer: 7 mL/min — ABNORMAL LOW (ref >60)
Phosphorus: 6.4 mg/dL — ABNORMAL HIGH (ref 2.3–4.6)

## 2011-05-28 ENCOUNTER — Other Ambulatory Visit (HOSPITAL_COMMUNITY): Payer: 59

## 2011-05-28 ENCOUNTER — Inpatient Hospital Stay (HOSPITAL_COMMUNITY): Payer: 59

## 2011-05-28 LAB — BASIC METABOLIC PANEL
BUN: 96 mg/dL — ABNORMAL HIGH (ref 6–23)
CO2: 26 mEq/L (ref 19–32)
Calcium: 8.4 mg/dL (ref 8.4–10.5)
Creatinine, Ser: 6.03 mg/dL — ABNORMAL HIGH (ref 0.50–1.10)
GFR calc non Af Amer: 7 mL/min — ABNORMAL LOW (ref >60)
Glucose, Bld: 88 mg/dL (ref 70–99)
Sodium: 138 mEq/L (ref 135–145)

## 2011-05-28 LAB — CBC
MCH: 29.5 pg (ref 26.0–34.0)
MCHC: 32 g/dL (ref 30.0–36.0)
Platelets: 252 10*3/uL (ref 150–400)
RBC: 2.95 MIL/uL — ABNORMAL LOW (ref 3.87–5.11)
RDW: 14.7 % (ref 11.5–15.5)

## 2011-05-28 LAB — PROTIME-INR: Prothrombin Time: 15.9 seconds — ABNORMAL HIGH (ref 11.6–15.2)

## 2011-05-28 MED ORDER — IOHEXOL 300 MG/ML  SOLN: 12.0000 mL | Freq: Once | INTRAMUSCULAR | Status: AC | PRN

## 2011-05-28 NOTE — H&P (Signed)
[name], PETTIE NO.:  [id number]  MEDICAL RECORD NO.:  [id number]  LOCATION:  5024                         FACILITY:  [facility]  PHYSICIAN:  Rosanna Randy, MDDATE OF BIRTH:  02/24/1956  DATE OF ADMISSION:  05/25/2011 DATE OF DISCHARGE:                             HISTORY & PHYSICAL   PRIMARY CARE PHYSICIAN:  Dr. Cliffton Asters.  NEPHROLOGIST:  Dr. Darrick Penna.  CHIEF COMPLAINT:  Left upper extremity swelling and pain.  The patient will be admitted to [facility] under triad hospitalist team 4.  HISTORY OF PRESENT ILLNESS:  The patient is a 55 year old female with a past medical history of lupus, chronic kidney disease stage V, hypertension, gout, and also remote history of GI bleed, who came into the hospital secondary to left upper extremity swelling and tenderness. The patient reports that on May 06, 2011, she had a graft placement on her left upper extremity in the forearm in anticipation for hemodialysis due to her chronic kidney disease.  After the graft was placed, the patient reports some local swelling that was attributed to be all secondary to the surgery, but her swollen continue worsening and is now involving her whole left upper extremity, is warm to touch and becomes tender.  The patient denies any fever, chest pain, shortness of breath, palpitations,, or any other complaints.  In the emergency department, she was found to have left subclavian clot by ultrasound and due to the history of GI bleed, triad hospitalist was called to admit the patient for anticoagulation and to make sure that she does not have any complications by this treatment.  ALLERGIES:  No known drug allergy.  PAST MEDICAL HISTORY:  Significant for, 1. 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 2. Lupus. 3. Hypertension. 4. Gout. 5. Gastroesophageal reflux disease with history of GI bleed. 6. Obesity. 7. Hyperphosphatemia.  MEDICATIONS: 1. Vitamin B12, 500 mg  1/2 tablet by mouth daily. 2. Tums Ultra 1000 mg 3 tablets three times a day with meals. 3. Sodium bicarbonate 650 mg one tablet by mouth twice a day. 4. Protonix 40 mg 1 tablet by mouth daily. 5. Prednisone 5 mg 1.5 tablets daily. 6. Metoprolol tartrate 25 mg 1 tablet daily at bedtime. 7. Hectorol 1 mcg 1 capsule twice a day. 8. Lasix 80 mg 1 tablet by mouth daily. 9. Flexeril 10 mg 1 tablet by mouth twice a day as needed for pain and     muscle spasm. 10.CellCept 250 mg 1 capsule by mouth twice a day. 11.Calcium acetate 667 mg 1 capsule twice a day with meals. 12.Allopurinol 300 mg 1/2 tablet by mouth daily.  SOCIAL HISTORY:  The patient denies alcohol.  Denies tobacco.  Denies any illicit drugs use.  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  FAMILY HISTORY:  Significant for heart attack on her mom, but actually may have died when she was 65 and prostate cancer on her father.  REVIEW OF SYSTEMS:  Otherwise negative except as mentioned on HPI.  PHYSICAL EXAM:  VITAL SIGNS:  Temperature 98.7, blood pressure 104/63, heart rate 95, respiratory rate 20, oxygen saturation 100% on room air. GENERAL:  The patient was  lying in bed, in no acute distress, cooperative to examination. HEENT:  Normocephalic.  No trauma.  Extraocular muscles intact.  PERRLA. No nystagmus.  There was no discharges from her ears or nostrils.  Good dentition.  No erythema.  No exudates. NECK:  Supple.  No JVD.  No bruits. RESPIRATORY SYSTEM:  Clear to auscultation bilaterally. HEART:  Positive, holosystolic murmur, regular rate and rhythm. ABDOMEN:  Obese, nontender, and nondistended.  Positive bowel sounds. EXTREMITIES:  Lower extremity was just trace of edema bilaterally.  Left upper extremity with significant swelling and also warm to touch, then swelling extends from her wrist all the way almost up to her shoulder. Pulses on her left upper extremity are palpable at the radial area. That is  decreased, but palpable thrill at the area where the graft was placed.  Right upper extremity normal in appearance and no swelling appreciated. SKIN:  No rash or petechiae.  Scars from surgery on the left upper extremity appreciated.  They appears to be well healed without any erythema or drainage. NEUROLOGIC:  The patient was alert and awake and oriented x3.  Cranial nerves II through XII intact.  Normal sensation to light touch and pinprick.  Normal gait.  LABORATORY DATA:  PTT 28, PT 14.2, INR 1.08.  CBC with differential showing a white blood cells of 11.8, hemoglobin 8.6, platelets 266. BMET:  Sodium 138, potassium 4.2, chloride 95, bicarb 28, glucose 97, BUN 93, creatinine 5.95.  The patient had an ultrasound, venous Doppler of her left upper extremities that demonstrated nonocclusive acute DVT in the left subclavian vein.  All other veins appear to be thrombosed free.  ASSESSMENT AND PLAN: 1. Left upper extremity swelling and pain secondary to deep venous     thrombosis.  We are going to start the patient on heparin and also     Coumadin as part of anticoagulation process and we are going to     consult Vascular surgeon for evaluation of her graft to make sure     that nothing else need to be done regarding this problem     specifically with the swelling that has been involved since was     placed. 2. Lupus that appears to be stable.  We are going to continue home     medications including her daily dose of prednisone and also     CellCept. 3. Chronic stage V kidney disease, also appears to be stable.  We are     going to continue the patient on sodium bicarbonate, Tums,     Hectorol, Lasix, calcium acetate, unless that her kidney function     deteriorates and her urine output decreased too much, we are going     to hold on consultation renal at this moment. 4. Hypertension.  We are going to continue using metoprolol. 5. History of gastrointestinal bleed and gastroesophageal  reflux     disease.  We are going to start the patient on Protonix. 6. Gout.  We are going to continue allopurinol. 7. Chronic leukocytosis, most likely associated to the use of steroids     and also her lupus.  We are going to continue monitoring white     blood cell strength at this moment. 8. Anemia of chronic disease with a ferritin in the 731 range.  We are     going to continue monitoring her hemoglobin.     Rosanna Randy, MD     CEM/MEDQ  D:  05/25/2011  T:  05/25/2011  Job:  [id number]  cc:   Dr. Darrick Penna Dr. Cliffton Asters  Electronically Signed by Vassie Loll MD on 05/28/2011 08:10:06 AM

## 2011-05-29 ENCOUNTER — Ambulatory Visit: Payer: 59

## 2011-05-29 LAB — RENAL FUNCTION PANEL
CO2: 23 mEq/L (ref 19–32)
Calcium: 8.3 mg/dL — ABNORMAL LOW (ref 8.4–10.5)
Chloride: 97 mEq/L (ref 96–112)
Creatinine, Ser: 5.98 mg/dL — ABNORMAL HIGH (ref 0.50–1.10)
GFR calc Af Amer: 9 mL/min — ABNORMAL LOW (ref >60)
GFR calc non Af Amer: 7 mL/min — ABNORMAL LOW (ref >60)
Glucose, Bld: 85 mg/dL (ref 70–99)

## 2011-05-29 LAB — CBC
HCT: 27.7 % — ABNORMAL LOW (ref 36.0–46.0)
MCH: 29.2 pg (ref 26.0–34.0)
MCHC: 31.8 g/dL (ref 30.0–36.0)
MCV: 92 fL (ref 78.0–100.0)
Platelets: 243 10*3/uL (ref 150–400)
RDW: 14.5 % (ref 11.5–15.5)

## 2011-05-30 NOTE — Discharge Summary (Signed)
  NAMEANELIS, HRIVNAK NO.:  [id number]  MEDICAL RECORD NO.:  [id number]  LOCATION:  5024                         FACILITY:  [facility]  PHYSICIAN:  Jonny Ruiz, MD    DATE OF BIRTH:  [birth year]  DATE OF ADMISSION:  05/24/2011 DATE OF DISCHARGE:  05/29/2011                              DISCHARGE SUMMARY   ADDENDUM  DISCHARGE MEDICATIONS:  Lovenox 90 mg subcu q.12 until the patient reaches therapeutic INR between 2 and 3.          ______________________________ Jonny Ruiz, MD     GL/MEDQ  D:  05/29/2011  T:  05/30/2011  Job:  [id number]  Electronically Signed by Jonny Ruiz MD on 05/30/2011 04:55:30 PM

## 2011-05-30 NOTE — Discharge Summary (Signed)
  [name], [name] NO.:  [id number]  MEDICAL RECORD NO.:  [id number]  LOCATION:  5024                         FACILITY:  [facility]  PHYSICIAN:  Jonny Ruiz, MD    DATE OF BIRTH:  06-12-1956  DATE OF ADMISSION:  05/24/2011 DATE OF DISCHARGE:                              DISCHARGE SUMMARY   ADDENDUM: Edit discharge medications once again.  Lovenox 90 mg subcu q.24 hours and not q.12 hours.          ______________________________ Jonny Ruiz, MD     GL/MEDQ  D:  05/29/2011  T:  05/29/2011  Job:  [id number]  Electronically Signed by Jonny Ruiz MD on 05/30/2011 04:54:53 PM

## 2011-05-30 NOTE — Discharge Summary (Signed)
NAMENAZLI, PENN NO.:  [id number]  MEDICAL RECORD NO.:  [id number]  LOCATION:  5024                         FACILITY:  [facility]  PHYSICIAN:  Jonny Ruiz, MD    DATE OF BIRTH:  1956-10-01  DATE OF ADMISSION:  05/24/2011 DATE OF DISCHARGE:  05/29/2011                              DISCHARGE SUMMARY   REASON FOR H0SPITALIZATION:  DVT of the left upper extremity.  FINAL DIAGNOSIS:  Resolved deep venous thrombosis (DVT) of the left subclavian vein.  SECONDARY DIAGNOSES: 1. Chronic kidney disease stage V. 2. Lupus. 3. Hypertension. 4. Gout. 5. Gastroesophageal reflux disease with history of gastrointestinal     bleed. 6. Hyperphosphatemia. 7. Obesity.  DISCHARGE MEDICATIONS: 1. Warfarin 7.5 mg p.o. daily. 2. Allopurinol 300 mg half tablet daily. 3. Calcium acetate 667 mg one tablet twice a day with meals. 4. CellCept 250 mg one tablet p.o. b.i.d. 5. Furosemide 80 mg one tablet daily. 6. Flexeril 10 mg b.i.d. p.r.n. muscle spasm. 7. Hectorol 1 mcg 1 tablet p.o. b.i.d. 8. Metoprolol tartrate 25 mg p.o. at bedtime. 9. Protonix 40 mg p.o. daily. 10.Prednisone 7.5 mg p.o. daily. 11.Sodium bicarbonate 650 mg two tablets by mouth twice a day. 12.Tums Ultra 1000 mg 3 tablets by mouth three times a day. 13.Vitamin B12, 500 mg half a tablet daily. 14.Lortab 5/500 one tablet p.o. q.i.d. p.r.n. pain, #28 for 7 days, no     refills.  PERTINENT LABORATORY DATA AND DIAGNOSTICS:  Venogram performed on May 28, 2011, reveal no evidence of central venous thrombosis artery stenosis.  The subclavian vein appears widely patent by venography.  No collateral vein filling is identified.  With retraction of the catheter, no significant thrombus was identified in the axillary vein or upper arm veins.  Ultrasound shows a patent forearm dialysis graft.  No obvious thrombus was identified in the deep or superficial venous system on the left arm by ultrasound.  HOSPITAL  COURSE:  Ms. Rome is a 55 year old black female with multiple medical problems including chronic kidney disease stage V anticipating dialysis, lupus, and hypertension, who was admitted to our hospital for DVT of the left upper extremity (left subclavian vein). After she underwent graft placement on May 06, 2011, in anticipation for dialysis.  After that she developed local swelling and pain and subsequently came to our emergency department where she was evaluated and was found to have a left subclavian clot by ultrasound.  The patient was admitted to the hospital and was treated with heparin and Coumadin appropriately.  Subsequently, Vascular Surgery was consulted and she underwent venography, which showed resolved DVT of the left upper extremities.  Subsequently, the patient was placed on Lovenox and once she was deemed ready for discharge, she was sent home on Lovenox 90 mg subcutaneous q.12 h along with Coumadin until she reaches a therapeutic INR between 2 and 3.  At present, the patient has an INR of 1.29. Hemoglobin 8.8.  She is stable from a medical standpoint and she will follow up at [facility] tomorrow where she will be evaluated for possible kidney transplantation.  The patient is in stable condition and is instructed to follow up with the Coumadin  Clinic to recheck her INR daily until she reaches her therapeutic INR.          ______________________________ Jonny Ruiz, MD     GL/MEDQ  D:  05/29/2011  T:  05/29/2011  Job:  [id number]  cc:   Dr. Cliffton Asters Dr. Bynum Bellows. Edilia Bo, M.D.  Electronically Signed by Jonny Ruiz MD on 05/30/2011 04:55:25 PM

## 2011-06-06 ENCOUNTER — Other Ambulatory Visit: Payer: Self-pay | Admitting: Nephrology

## 2011-06-06 ENCOUNTER — Encounter (HOSPITAL_COMMUNITY): Payer: 59 | Attending: Nephrology

## 2011-06-06 DIAGNOSIS — D638 Anemia in other chronic diseases classified elsewhere: Secondary | ICD-10-CM | POA: Insufficient documentation

## 2011-06-06 DIAGNOSIS — N185 Chronic kidney disease, stage 5: Secondary | ICD-10-CM | POA: Insufficient documentation

## 2011-06-07 ENCOUNTER — Ambulatory Visit: Payer: 59 | Admitting: Vascular Surgery

## 2011-06-27 ENCOUNTER — Other Ambulatory Visit: Payer: Self-pay | Admitting: Nephrology

## 2011-06-27 ENCOUNTER — Encounter (HOSPITAL_COMMUNITY): Payer: 59 | Attending: Nephrology

## 2011-06-27 DIAGNOSIS — N185 Chronic kidney disease, stage 5: Secondary | ICD-10-CM | POA: Insufficient documentation

## 2011-06-27 DIAGNOSIS — D638 Anemia in other chronic diseases classified elsewhere: Secondary | ICD-10-CM | POA: Insufficient documentation

## 2011-06-27 LAB — FERRITIN: Ferritin: 796 ng/mL — ABNORMAL HIGH (ref 10–291)

## 2011-06-27 LAB — IRON AND TIBC
Iron: 33 ug/dL — ABNORMAL LOW (ref 42–135)
TIBC: 243 ug/dL — ABNORMAL LOW (ref 250–470)
UIBC: 210 ug/dL

## 2011-06-27 LAB — POCT HEMOGLOBIN-HEMACUE: Hemoglobin: 7.5 g/dL — ABNORMAL LOW (ref 12.0–15.0)

## 2011-07-04 ENCOUNTER — Other Ambulatory Visit: Payer: Self-pay | Admitting: Nephrology

## 2011-07-04 ENCOUNTER — Encounter (HOSPITAL_COMMUNITY): Payer: 59

## 2011-07-11 ENCOUNTER — Encounter (HOSPITAL_COMMUNITY): Payer: 59

## 2011-07-11 ENCOUNTER — Other Ambulatory Visit: Payer: Self-pay | Admitting: Nephrology

## 2011-07-18 ENCOUNTER — Other Ambulatory Visit: Payer: Self-pay | Admitting: Nephrology

## 2011-07-18 ENCOUNTER — Encounter (HOSPITAL_COMMUNITY): Payer: 59

## 2011-07-18 LAB — POCT HEMOGLOBIN-HEMACUE: Hemoglobin: 9.5 g/dL — ABNORMAL LOW (ref 12.0–15.0)

## 2011-07-24 ENCOUNTER — Other Ambulatory Visit: Payer: Self-pay | Admitting: Nephrology

## 2011-07-24 ENCOUNTER — Encounter (HOSPITAL_COMMUNITY): Payer: 59 | Attending: Nephrology

## 2011-07-24 DIAGNOSIS — N185 Chronic kidney disease, stage 5: Secondary | ICD-10-CM | POA: Insufficient documentation

## 2011-07-24 DIAGNOSIS — D638 Anemia in other chronic diseases classified elsewhere: Secondary | ICD-10-CM | POA: Insufficient documentation

## 2011-07-24 LAB — FERRITIN: Ferritin: 458 ng/mL — ABNORMAL HIGH (ref 10–291)

## 2011-07-24 LAB — IRON AND TIBC
Iron: 35 ug/dL — ABNORMAL LOW (ref 42–135)
Saturation Ratios: 20 % (ref 20–55)
UIBC: 143 ug/dL (ref 125–400)

## 2011-07-29 ENCOUNTER — Emergency Department (HOSPITAL_COMMUNITY)
Admission: EM | Admit: 2011-07-29 | Discharge: 2011-07-30 | Discharge status: Against Medical Advice | Payer: 59 | Attending: Emergency Medicine | Admitting: Emergency Medicine

## 2011-07-30 ENCOUNTER — Emergency Department (HOSPITAL_COMMUNITY)
Admission: EM | Admit: 2011-07-30 | Discharge: 2011-07-30 | Disposition: A | Discharge status: Home / Self Care | Payer: 59 | Attending: Emergency Medicine | Admitting: Emergency Medicine

## 2011-07-30 ENCOUNTER — Emergency Department (HOSPITAL_COMMUNITY): Payer: 59

## 2011-07-30 DIAGNOSIS — R109 Unspecified abdominal pain: Secondary | ICD-10-CM | POA: Insufficient documentation

## 2011-07-30 DIAGNOSIS — M329 Systemic lupus erythematosus, unspecified: Secondary | ICD-10-CM | POA: Insufficient documentation

## 2011-07-30 DIAGNOSIS — I120 Hypertensive chronic kidney disease with stage 5 chronic kidney disease or end stage renal disease: Secondary | ICD-10-CM | POA: Insufficient documentation

## 2011-07-30 DIAGNOSIS — N185 Chronic kidney disease, stage 5: Secondary | ICD-10-CM | POA: Insufficient documentation

## 2011-07-30 DIAGNOSIS — R112 Nausea with vomiting, unspecified: Secondary | ICD-10-CM | POA: Insufficient documentation

## 2011-07-30 LAB — DIFFERENTIAL
Eosinophils Absolute: 0.2 10*3/uL (ref 0.0–0.7)
Monocytes Absolute: 1.1 10*3/uL — ABNORMAL HIGH (ref 0.1–1.0)
Neutrophils Relative %: 77 % (ref 43–77)

## 2011-07-30 LAB — URINALYSIS, ROUTINE W REFLEX MICROSCOPIC
Glucose, UA: 100 mg/dL — AB
Hgb urine dipstick: NEGATIVE
Ketones, ur: NEGATIVE mg/dL
Protein, ur: 100 mg/dL — AB
Specific Gravity, Urine: 1.008 (ref 1.005–1.030)
Urobilinogen, UA: 0.2 mg/dL (ref 0.0–1.0)

## 2011-07-30 LAB — COMPREHENSIVE METABOLIC PANEL
AST: 24 U/L (ref 0–37)
Albumin: 4.1 g/dL (ref 3.5–5.2)
Chloride: 103 mEq/L (ref 96–112)
Creatinine, Ser: 5.84 mg/dL — ABNORMAL HIGH (ref 0.50–1.10)
Potassium: 5.2 mEq/L — ABNORMAL HIGH (ref 3.5–5.1)
Total Bilirubin: 0.3 mg/dL (ref 0.3–1.2)

## 2011-07-30 LAB — CBC
MCV: 98.8 fL (ref 78.0–100.0)
Platelets: 202 10*3/uL (ref 150–400)
RDW: 16 % — ABNORMAL HIGH (ref 11.5–15.5)
WBC: 15.8 10*3/uL — ABNORMAL HIGH (ref 4.0–10.5)

## 2011-07-30 LAB — URINE MICROSCOPIC-ADD ON

## 2011-08-01 ENCOUNTER — Other Ambulatory Visit: Payer: Self-pay | Admitting: Nephrology

## 2011-08-01 ENCOUNTER — Encounter (HOSPITAL_COMMUNITY): Payer: 59

## 2011-08-01 LAB — POCT HEMOGLOBIN-HEMACUE: Hemoglobin: 13.1 g/dL (ref 12.0–15.0)

## 2011-08-07 LAB — CBC
HCT: 35.2 — ABNORMAL LOW
Hemoglobin: 11.4 — ABNORMAL LOW
RBC: 4.06
RDW: 14

## 2011-08-09 LAB — CBC
HCT: 33 — ABNORMAL LOW
Hemoglobin: 10.8 — ABNORMAL LOW
MCHC: 32.7
MCV: 87.4
RDW: 14.3

## 2011-08-09 LAB — MISCELLANEOUS TEST: Miscellaneous Test Results: NOT DETECTED

## 2011-08-09 LAB — IRON AND TIBC: TIBC: 229 — ABNORMAL LOW

## 2011-08-12 LAB — IRON AND TIBC
Iron: 72
Saturation Ratios: 34
TIBC: 211 — ABNORMAL LOW

## 2011-08-12 LAB — CBC
Hemoglobin: 11.4 — ABNORMAL LOW
RBC: 3.96
WBC: 7.6

## 2011-08-12 LAB — FERRITIN: Ferritin: 352 — ABNORMAL HIGH (ref 10–291)

## 2011-08-13 LAB — CBC
Platelets: 215
RDW: 14.3
WBC: 8.7

## 2011-08-13 LAB — IRON AND TIBC
Iron: 68
Saturation Ratios: 32
UIBC: 142

## 2011-08-14 LAB — FERRITIN: Ferritin: 380 — ABNORMAL HIGH (ref 10–291)

## 2011-08-14 LAB — IRON AND TIBC
Iron: 63
TIBC: 204 — ABNORMAL LOW

## 2011-08-14 LAB — CBC
Hemoglobin: 10.9 — ABNORMAL LOW
MCHC: 33.3
MCV: 86.3
RDW: 13.4

## 2011-08-15 ENCOUNTER — Other Ambulatory Visit: Payer: Self-pay | Admitting: Nephrology

## 2011-08-15 ENCOUNTER — Encounter (HOSPITAL_COMMUNITY)
Admission: RE | Admit: 2011-08-15 | Discharge: 2011-08-15 | Payer: 59 | Source: Ambulatory Visit | Attending: Nephrology | Admitting: Nephrology

## 2011-08-15 LAB — BASIC METABOLIC PANEL
CO2: 27
Calcium: 9.2
Calcium: 8.4
Chloride: 106
Chloride: 108
Creatinine, Ser: 3.51 — ABNORMAL HIGH
GFR calc Af Amer: 18 — ABNORMAL LOW
GFR calc non Af Amer: 15 — ABNORMAL LOW
GFR calc non Af Amer: 14 — ABNORMAL LOW
Glucose, Bld: 99
Glucose, Bld: 101 — ABNORMAL HIGH
Glucose, Bld: 122 — ABNORMAL HIGH
Potassium: 4.5
Potassium: 3.9
Sodium: 145
Sodium: 140

## 2011-08-15 LAB — HEMOGLOBIN AND HEMATOCRIT, BLOOD
HCT: 35.4 — ABNORMAL LOW
HCT: 32.5 — ABNORMAL LOW
Hemoglobin: 11.5 — ABNORMAL LOW
Hemoglobin: 10.5 — ABNORMAL LOW

## 2011-08-15 LAB — CBC
HCT: 31.4 — ABNORMAL LOW
HCT: 30.1 — ABNORMAL LOW
HCT: 32.2 — ABNORMAL LOW
Hemoglobin: 10.4 — ABNORMAL LOW
Hemoglobin: 9.9 — ABNORMAL LOW
MCHC: 32.7
MCHC: 32.8
MCV: 87.2
MCV: 86.5
MCV: 86.6
Platelets: 211
Platelets: 264
RDW: 14.1
RDW: 14.5
RDW: 14.7
WBC: 9.1
WBC: 8.7

## 2011-08-15 LAB — DIFFERENTIAL
Basophils Absolute: 0
Basophils Absolute: 0
Basophils Relative: 0
Basophils Relative: 0
Eosinophils Absolute: 0
Eosinophils Absolute: 0.1
Eosinophils Relative: 0
Monocytes Absolute: 0.9
Neutro Abs: 7.9 — ABNORMAL HIGH
Neutrophils Relative %: 74

## 2011-08-15 LAB — IRON AND TIBC
Saturation Ratios: 33
UIBC: 126

## 2011-08-15 LAB — VITAMIN B1: Vitamin B1 (Thiamine): 13 nmol/L (ref 9–44)

## 2011-08-16 LAB — CBC
HCT: 36
Hemoglobin: 11.9 — ABNORMAL LOW
MCHC: 33.1
RDW: 14.1

## 2011-08-16 LAB — POCT HEMOGLOBIN-HEMACUE: Hemoglobin: 12.4 g/dL (ref 12.0–15.0)

## 2011-08-19 LAB — POCT HEMOGLOBIN-HEMACUE: Hemoglobin: 12.4 g/dL (ref 12.0–15.0)

## 2011-08-19 LAB — HEMOGLOBIN AND HEMATOCRIT, BLOOD
HCT: 34.4 — ABNORMAL LOW
Hemoglobin: 11.1 — ABNORMAL LOW

## 2011-08-22 LAB — IRON AND TIBC
Iron: 74 ug/dL (ref 42–135)
TIBC: 196 ug/dL — ABNORMAL LOW (ref 250–470)
UIBC: 122 ug/dL
UIBC: 140 ug/dL

## 2011-08-22 LAB — POCT HEMOGLOBIN-HEMACUE: Hemoglobin: 11 g/dL — ABNORMAL LOW (ref 12.0–15.0)

## 2011-08-23 LAB — IRON AND TIBC: UIBC: 134

## 2011-08-23 LAB — FERRITIN: Ferritin: 440 — ABNORMAL HIGH (ref 10–291)

## 2011-08-27 LAB — IRON AND TIBC
Saturation Ratios: 37
TIBC: 218 — ABNORMAL LOW
UIBC: 137

## 2011-08-27 LAB — CBC
Hemoglobin: 11.4 — ABNORMAL LOW
RBC: 4.08

## 2011-08-27 LAB — FERRITIN: Ferritin: 372 — ABNORMAL HIGH (ref 10–291)

## 2011-08-29 ENCOUNTER — Encounter (HOSPITAL_COMMUNITY): Payer: 59

## 2011-08-29 ENCOUNTER — Other Ambulatory Visit: Payer: Self-pay | Admitting: Nephrology

## 2011-08-29 DIAGNOSIS — D638 Anemia in other chronic diseases classified elsewhere: Secondary | ICD-10-CM | POA: Insufficient documentation

## 2011-08-29 DIAGNOSIS — N185 Chronic kidney disease, stage 5: Secondary | ICD-10-CM | POA: Insufficient documentation

## 2011-08-29 LAB — IRON AND TIBC
Iron: 86
Saturation Ratios: 33
Saturation Ratios: 19 % — ABNORMAL LOW (ref 20–55)
TIBC: 259
UIBC: 173

## 2011-08-29 LAB — CBC
Platelets: 230
WBC: 11.5 — ABNORMAL HIGH

## 2011-08-30 ENCOUNTER — Other Ambulatory Visit: Payer: Self-pay

## 2011-08-30 DIAGNOSIS — Z01810 Encounter for preprocedural cardiovascular examination: Secondary | ICD-10-CM

## 2011-08-30 DIAGNOSIS — N189 Chronic kidney disease, unspecified: Secondary | ICD-10-CM

## 2011-08-30 LAB — CBC
HCT: 34.2 — ABNORMAL LOW
MCV: 86.1
RBC: 3.97
WBC: 11.5 — ABNORMAL HIGH

## 2011-08-30 LAB — IRON AND TIBC
Iron: 56
UIBC: 158

## 2011-09-02 LAB — CBC
HCT: 33.2 — ABNORMAL LOW
Platelets: 267
WBC: 10.7 — ABNORMAL HIGH

## 2011-09-02 LAB — IRON AND TIBC
Saturation Ratios: 28
UIBC: 164

## 2011-09-03 ENCOUNTER — Other Ambulatory Visit: Payer: 59

## 2011-09-05 ENCOUNTER — Encounter (HOSPITAL_COMMUNITY): Payer: 59 | Attending: Nephrology

## 2011-09-05 ENCOUNTER — Encounter (HOSPITAL_COMMUNITY): Payer: 59

## 2011-09-05 ENCOUNTER — Ambulatory Visit: Payer: 59 | Admitting: Vascular Surgery

## 2011-09-05 ENCOUNTER — Other Ambulatory Visit: Payer: Self-pay | Admitting: Nephrology

## 2011-09-12 ENCOUNTER — Encounter (HOSPITAL_COMMUNITY)
Admission: RE | Admit: 2011-09-12 | Discharge: 2011-09-12 | Payer: 59 | Source: Ambulatory Visit | Attending: Nephrology | Admitting: Nephrology

## 2011-09-12 ENCOUNTER — Other Ambulatory Visit: Payer: Self-pay | Admitting: Nephrology

## 2011-09-13 LAB — POCT HEMOGLOBIN-HEMACUE: Hemoglobin: 12.1 g/dL (ref 12.0–15.0)

## 2011-09-18 ENCOUNTER — Other Ambulatory Visit (INDEPENDENT_AMBULATORY_CARE_PROVIDER_SITE_OTHER): Payer: 59 | Admitting: *Deleted

## 2011-09-18 ENCOUNTER — Ambulatory Visit (INDEPENDENT_AMBULATORY_CARE_PROVIDER_SITE_OTHER): Payer: 59 | Admitting: Physician Assistant

## 2011-09-18 ENCOUNTER — Encounter: Payer: Self-pay | Admitting: Physician Assistant

## 2011-09-18 VITALS — BP 131/73 | HR 86 | Resp 18 | Ht 61.0 in | Wt 206.0 lb

## 2011-09-18 DIAGNOSIS — N186 End stage renal disease: Secondary | ICD-10-CM

## 2011-09-18 DIAGNOSIS — T82898A Other specified complication of vascular prosthetic devices, implants and grafts, initial encounter: Secondary | ICD-10-CM

## 2011-09-18 DIAGNOSIS — Z01810 Encounter for preprocedural cardiovascular examination: Secondary | ICD-10-CM

## 2011-09-18 DIAGNOSIS — N189 Chronic kidney disease, unspecified: Secondary | ICD-10-CM

## 2011-09-18 NOTE — Progress Notes (Signed)
[facility] [place] HISTORY AND PHYSICAL   CC: clotted graft  Referring MD: [name], MD  HPI: This is a 55 y.o. female here for clotted graft.  She states that Dr. Detterding referred her for a clotted graft, which possibly has been occluded 3 weeks ago.  She has done well since the swelling from her surgery has gone down.  She has no symptoms of steal.  Her motor and sensation are in tact.  Has history of DVTs but has been off coumadin since July.  Past Medical History  Diagnosis Date  . Lupus lupus with nephritis  . Chronic kidney disease   . Sleep apnea, obstructive   . Morbid obesity   . DVT (deep venous thrombosis)   . Gout   . Anemia   . Tachycardia   . GI (gastrointestinal bleed)   . Hyperlipidemia    Past Surgical History  Procedure Date  . Gastric bypass   . Inferior vena cava angioplasty / stenting     No Known Allergies  Current Outpatient Prescriptions  Medication Sig Dispense Refill  . allopurinol (ZYLOPRIM) 150 mg TABS Take 150 mg by mouth daily.        . calcium carbonate (TUMS - DOSED IN MG ELEMENTAL CALCIUM) 500 MG chewable tablet Chew 2 tablets by mouth daily.        . Cyanocobalamin (VITAMIN B-12 PO) Take by mouth 1 day or 1 dose.        . cyclobenzaprine (FLEXERIL) 10 MG tablet Take 10 mg by mouth as needed.        . Doxercalciferol (HECTOROL PO) Take 2 mg by mouth 2 (two) times daily.        . furosemide (LASIX) 80 MG tablet Take 80 mg by mouth daily.        Marland Kitchen glucosamine-chondroitin 500-400 MG tablet Take 2 tablets by mouth 2 (two) times daily at 10 AM and 5 PM.        . metoprolol succinate (TOPROL-XL) 25 MG 24 hr tablet Take 25 mg by mouth daily.        . mycophenolate (CELLCEPT) 500 MG tablet Take 1,000 mg by mouth 2 (two) times daily.        . pantoprazole (PROTONIX) 40 MG tablet Take 40 mg by mouth 1 day or 1 dose.        . predniSONE (DELTASONE) 10 MG tablet Take 10 mg by mouth daily.        . sodium bicarbonate  650 MG tablet Take 650 mg by mouth 2 (two) times daily.          Family Hx: Mother deceased at 22 with MI Father deceased at late 65s with prostate cancer  History   Social History  . Marital Status: Single    Spouse Name: N/A    Number of Children: N/A  . Years of Education: N/A   Occupational History  . Not on file.   Social History Main Topics  . Smoking status: Current Everyday Smoker -- 0.2 packs/day  . Smokeless tobacco: Not on file  . Alcohol Use:   . Drug Use:   . Sexually Active:    Other Topics Concern  . Not on file   Social History Narrative  . No narrative on file     ROS: [x]  Positive   [ ]  Negative   [ ]  All sytems reviewed and are negative  General: [ ]  Weight loss, [ ]  Fever, [ ]   chills Neurologic: [ ]  Dizziness, [ ]  Blackouts, [ ]  Seizure [ ]  Stroke, [ ]  "Mini stroke", [ ]  Slurred speech, [ ]  Temporary blindness; [ ]  weakness in arms or legs Cardiac: [ ]  Chest pain/pressure, [ ]  Shortness of breath at rest [ ]  Shortness of breath with exertion, [ ]  Atrial fibrillation or irregular heartbeat Vascular: [ ]  Pain in legs with walking, [ ]  Pain in legs at rest, [ ]  Pain in legs at night,  [ ]  Non-healing ulcer, [ ]  Blood clot in vein/DVT,   Pulmonary: [ ]  Home oxygen, [ ]  Productive cough, [ ]  hemoptysis, [ ]  Asthma,  [ ]  Wheezing Musculoskeletal:  [ ]  Arthritis, [ ] ; [ ]  Joint pain Hematologic: [ ]  Easy Bruising, [ ]  Anemia; [ ]  Hepatitis Gastrointestinal: [ ]  Melena, [X]  Hx of stomach ulcers;  [ ]  Gastroesophageal Reflux/heartburn, [ ]  Trouble swallowing Urinary: [X]  chronic Kidney disease, [ ]  on HD - [ ]  MWF or [ ]  TTHS, [ ]  Burning with urination, [X]  Difficulty urinating; [X]  Hematuria Skin: [ ]  Rashes, [ ]  Wounds Psychological: [ ]  Anxiety, [ ]  Depression   PHYSICAL EXAMINATION:  General:  WDWN in NAD Gait: Normal HENT: WNL Eyes: PERRL Pulmonary: normal non-labored breathing , without Rales, rhonchi,  wheezing Cardiac: RRR, without   Murmurs, rubs or gallops; No carotid bruits Abdomen: soft, NT, no masses Skin: no rashes, ulcers noted Vascular Exam/Pulses: 2+ radial pulses bilaterally; there is no thrill or bruit in her left forearm graft.  Extremities without ischemic changes, no Gangrene , no cellulitis; no open wounds;  Musculoskeletal: no muscle wasting or atrophy  Neurologic: A&O X 3; Appropriate Affect ; SENSATION: normal; MOTOR FUNCTION:  moving all extremities equally. Speech is fluent/normal  Non-Invasive Vascular Imaging:  Vein mapping of the RUE revealed that veins are not sufficient for a R arm AVF.   ASSESSMENT: 55 y.o. female with clotted left forearm graft.  PLAN: We will set up appt for her to go to interventional radiology for thrombolysis of graft.  If this is unsuccessful, we will have her return to see Dr. Imogene Burn for possible thrombectomy and revision of graft of possible new LUA AVG.   [name], PA-C 09/18/11    Clinic MD Edilia Bo

## 2011-09-20 ENCOUNTER — Other Ambulatory Visit (HOSPITAL_COMMUNITY): Payer: Self-pay | Admitting: *Deleted

## 2011-09-25 NOTE — Procedures (Unsigned)
CEPHALIC VEIN MAPPING  INDICATION:  Clotted left AV graft  HISTORY:  EXAM: The right cephalic vein is compressible with diameter measurements ranging from 0.15 to 0.25 cm.  The right basilic vein is compressible with diameter measurements ranging from 0.14 to 0.26 cm.  See attached worksheet for all measurements.  IMPRESSION:  Patent right cephalic and basilic veins with diameter measurements as described above.  ___________________________________________ Di Kindle. Edilia Bo, M.D.  CH/MEDQ  D:  09/20/2011  T:  09/20/2011  Job:  [id number]

## 2011-09-26 ENCOUNTER — Encounter (HOSPITAL_COMMUNITY)
Admission: RE | Admit: 2011-09-26 | Discharge: 2011-09-26 | Disposition: A | Discharge status: Home / Self Care | Payer: 59 | Source: Ambulatory Visit | Attending: Nephrology | Admitting: Nephrology

## 2011-09-26 DIAGNOSIS — N185 Chronic kidney disease, stage 5: Secondary | ICD-10-CM | POA: Insufficient documentation

## 2011-09-26 DIAGNOSIS — D638 Anemia in other chronic diseases classified elsewhere: Secondary | ICD-10-CM | POA: Insufficient documentation

## 2011-09-26 LAB — IRON AND TIBC: UIBC: 138 ug/dL (ref 125–400)

## 2011-09-26 LAB — POCT HEMOGLOBIN-HEMACUE: Hemoglobin: 12.3 g/dL (ref 12.0–15.0)

## 2011-09-26 LAB — FERRITIN: Ferritin: 1023 ng/mL — ABNORMAL HIGH (ref 10–291)

## 2011-09-26 MED ORDER — EPOETIN ALFA 10000 UNIT/ML IJ SOLN: 40000.0000 [IU] | INTRAMUSCULAR | Status: DC

## 2011-09-27 ENCOUNTER — Other Ambulatory Visit: Payer: Self-pay | Admitting: Vascular Surgery

## 2011-09-27 DIAGNOSIS — N186 End stage renal disease: Secondary | ICD-10-CM

## 2011-10-04 ENCOUNTER — Encounter (HOSPITAL_COMMUNITY): Payer: Self-pay

## 2011-10-04 ENCOUNTER — Other Ambulatory Visit: Payer: Self-pay | Admitting: Vascular Surgery

## 2011-10-04 ENCOUNTER — Ambulatory Visit (HOSPITAL_COMMUNITY)
Admission: RE | Admit: 2011-10-04 | Discharge: 2011-10-04 | Disposition: A | Discharge status: Home / Self Care | Payer: 59 | Source: Ambulatory Visit | Attending: Vascular Surgery | Admitting: Vascular Surgery

## 2011-10-04 DIAGNOSIS — Z992 Dependence on renal dialysis: Secondary | ICD-10-CM

## 2011-10-04 DIAGNOSIS — Y832 Surgical operation with anastomosis, bypass or graft as the cause of abnormal reaction of the patient, or of later complication, without mention of misadventure at the time of the procedure: Secondary | ICD-10-CM | POA: Insufficient documentation

## 2011-10-04 DIAGNOSIS — T82898A Other specified complication of vascular prosthetic devices, implants and grafts, initial encounter: Secondary | ICD-10-CM | POA: Insufficient documentation

## 2011-10-04 DIAGNOSIS — N186 End stage renal disease: Secondary | ICD-10-CM

## 2011-10-04 DIAGNOSIS — I871 Compression of vein: Secondary | ICD-10-CM | POA: Insufficient documentation

## 2011-10-04 DIAGNOSIS — N189 Chronic kidney disease, unspecified: Secondary | ICD-10-CM | POA: Insufficient documentation

## 2011-10-04 HISTORY — DX: Bariatric surgery status: Z98.84

## 2011-10-04 LAB — POTASSIUM: Potassium: 5.2 mEq/L — ABNORMAL HIGH (ref 3.5–5.1)

## 2011-10-04 MED ORDER — MIDAZOLAM HCL 2 MG/2ML IJ SOLN
INTRAMUSCULAR | Status: AC
  Administered 2011-10-04: 1 mg via INTRAVENOUS
  Filled 2011-10-04: qty 4

## 2011-10-04 MED ORDER — IOHEXOL 300 MG/ML  SOLN
100.0000 mL | Freq: Once | INTRAMUSCULAR | Status: AC | PRN
  Administered 2011-10-04: 5 mL via INTRAVENOUS

## 2011-10-04 MED ORDER — HEPARIN SODIUM (PORCINE) 1000 UNIT/ML IJ SOLN
INTRAMUSCULAR | Status: AC
  Filled 2011-10-04: qty 1

## 2011-10-04 MED ORDER — ALTEPLASE 100 MG IV SOLR
2.0000 mg | INTRAVENOUS | Status: DC
  Filled 2011-10-04: qty 2

## 2011-10-04 MED ORDER — FENTANYL CITRATE 0.05 MG/ML IJ SOLN
INTRAMUSCULAR | Status: AC
  Administered 2011-10-04: 50 ug via INTRAVENOUS
  Filled 2011-10-04: qty 4

## 2011-10-04 NOTE — ED Notes (Signed)
Complaints of numbness and strange feeling left hand since post case. Dr. Miles Costain assessed hand at 1040. Left finger tips Bluish tint now with radial pulse present, Pulse ox wave left thumb present. Has good movements of fingers and complains of numbness. Dr Miles Costain aware. Warm blanket to left hand. Tolerating oral fluids and crackers.

## 2011-10-04 NOTE — Procedures (Signed)
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 Unable to declot Needs surgical revision of venous anastomosis No comp Stable D/W Dr Imogene Burn

## 2011-10-04 NOTE — ED Notes (Signed)
C Lyles PA called and aware pt se potassium 5.2 and that specimen was slightly hemolyzed. Aware pt not on dialysis, graft completely clotted and is a pt of Dr. Otho Bellows.

## 2011-10-04 NOTE — ED Notes (Addendum)
Dr. Miles Costain aware K 5.2 and slightly hemolyzed

## 2011-10-04 NOTE — ED Notes (Signed)
Left hand fingers much pinker with improved cap refill and less numbness. Dr Miles Costain aware and is OK with discharging pt. He told her to come back to radiology today if symptoms worsen and to ED after 5 if symptoms worsen. Left radial pulses stronger. Pt called her ride

## 2011-10-04 NOTE — ED Notes (Signed)
Notified Dr. Miles Costain re left hand, bluish, sluggish cap refill, numbness and Left arm feels tired. Unable to pick up pulse ox left hand. Left radial pulse palpable. Dr Miles Costain in to assess.

## 2011-10-04 NOTE — H&P (Signed)
[name] is an 55 y.o. female.   Chief Complaint: Chronic Kidney disease/altho stable Bun/Cr HPI: L arm graft thrombolysis/angioplasty and/or stent placement Pt has not started dialysis yet/plan for start is yet undetermined L arm graft placed 04/2011---never used. Last known pulse/thrill was over 1 month ago  Past Medical History  Diagnosis Date  . Lupus lupus with nephritis  . Chronic kidney disease   . Sleep apnea, obstructive   . Morbid obesity   . DVT (deep venous thrombosis)   . Gout   . Anemia   . Tachycardia   . GI (gastrointestinal bleed)   . Hyperlipidemia     Past Surgical History  Procedure Date  . Gastric bypass   . Inferior vena cava angioplasty / stenting   . Abdominal hysterectomy   . Laparoscopic gastric bypass 2010    History reviewed. No pertinent family history. Social History:  reports that she quit smoking about 4 months ago. Her smoking use included Cigarettes. She has a 5 pack-year smoking history. She has never used smokeless tobacco. She reports that she does not drink alcohol or use illicit drugs.  Allergies: No Known Allergies  Medications Prior to Admission  Medication Sig Dispense Refill  . allopurinol (ZYLOPRIM) 150 mg TABS Take 150 mg by mouth daily.        . calcium acetate, Phos Binder, (PHOSLYRA) 667 MG/5ML SOLN Take by mouth 3 (three) times daily with meals.        . calcium carbonate (TUMS - DOSED IN MG ELEMENTAL CALCIUM) 500 MG chewable tablet Chew 2 tablets by mouth daily.        . Cyanocobalamin (VITAMIN B-12 PO) Take by mouth 1 day or 1 dose.        . cyclobenzaprine (FLEXERIL) 10 MG tablet Take 10 mg by mouth as needed.        . Doxercalciferol (HECTOROL PO) Take 2 mg by mouth 2 (two) times daily.        . furosemide (LASIX) 80 MG tablet Take 80 mg by mouth daily.        Marland Kitchen glucosamine-chondroitin 500-400 MG tablet Take 2 tablets by mouth 2 (two) times daily at 10 AM and 5 PM.        . metoprolol succinate (TOPROL-XL) 25 MG  24 hr tablet Take 25 mg by mouth daily.        . mycophenolate (CELLCEPT) 500 MG tablet Take 1,000 mg by mouth 2 (two) times daily.        . pantoprazole (PROTONIX) 40 MG tablet Take 40 mg by mouth 1 day or 1 dose.        . predniSONE (DELTASONE) 10 MG tablet Take 10 mg by mouth daily.        . sodium bicarbonate 650 MG tablet Take 650 mg by mouth 2 (two) times daily.         Medications Prior to Admission  Medication Dose Route Frequency Provider Last Rate Last Dose  . alteplase (ACTIVASE) injection 2 mg  2 mg Intracatheter STAT Michael T. Shick        No results found for this or any previous visit (from the past 48 hour(s)). No results found.  Review of Systems  Constitutional: Negative for fever.  Eyes: Negative for blurred vision.  Respiratory: Negative for cough.   Cardiovascular: Negative for chest pain.  Gastrointestinal: Negative for nausea, vomiting, abdominal pain and diarrhea.  Neurological: Negative for headaches.    There were no vitals taken  for this visit. Physical Exam  Constitutional: She is oriented to person, place, and time. She appears well-developed and well-nourished.  HENT:  Head: Normocephalic.  Eyes: EOM are normal.  Neck: Normal range of motion.  Cardiovascular: Normal rate, regular rhythm and normal heart sounds.   No murmur heard. Respiratory: Effort normal and breath sounds normal. No respiratory distress. She has no wheezes.  GI: Soft. Bowel sounds are normal. She exhibits no mass. There is no tenderness.  Musculoskeletal: Normal range of motion.  Neurological: She is alert and oriented to person, place, and time.     Assessment/Plan Pt scheduled for Left arm graft thrombolysis/poss angioplasty/stent placement. Pt understands procedure benefits and risks and agreeable to proceed. Consent signed.  Kariann Wecker A 10/04/2011, 9:28 AM

## 2011-10-04 NOTE — ED Notes (Signed)
Discharge teaching re post sedation, site care, and to return if worsening symptoms left arm/hand reviewed and teaching sheets given. Aware to resume her medications. Also reviewed these with friend driving her home. Discharged w/c with RN to be driven home by friend.

## 2011-10-08 ENCOUNTER — Other Ambulatory Visit: Payer: Self-pay

## 2011-10-08 DIAGNOSIS — Z01810 Encounter for preprocedural cardiovascular examination: Secondary | ICD-10-CM

## 2011-10-08 DIAGNOSIS — N186 End stage renal disease: Secondary | ICD-10-CM

## 2011-10-14 ENCOUNTER — Encounter (HOSPITAL_COMMUNITY)
Admission: RE | Admit: 2011-10-14 | Discharge: 2011-10-14 | Disposition: A | Discharge status: Home / Self Care | Payer: 59 | Source: Ambulatory Visit | Attending: Nephrology | Admitting: Nephrology

## 2011-10-14 MED ORDER — EPOETIN ALFA 10000 UNIT/ML IJ SOLN: 40000.0000 [IU] | INTRAMUSCULAR | Status: DC

## 2011-10-14 MED ORDER — EPOETIN ALFA 40000 UNIT/ML IJ SOLN
INTRAMUSCULAR | Status: AC
  Administered 2011-10-14: 40000 [IU] via SUBCUTANEOUS
  Filled 2011-10-14: qty 1

## 2011-10-17 ENCOUNTER — Encounter: Payer: Self-pay | Admitting: Vascular Surgery

## 2011-10-18 ENCOUNTER — Encounter: Payer: Self-pay | Admitting: Vascular Surgery

## 2011-10-18 ENCOUNTER — Other Ambulatory Visit: Payer: 59

## 2011-10-18 ENCOUNTER — Ambulatory Visit (INDEPENDENT_AMBULATORY_CARE_PROVIDER_SITE_OTHER): Payer: 59 | Admitting: Vascular Surgery

## 2011-10-18 VITALS — BP 130/81 | HR 85 | Resp 16 | Ht 61.5 in | Wt 206.0 lb

## 2011-10-18 DIAGNOSIS — N185 Chronic kidney disease, stage 5: Secondary | ICD-10-CM | POA: Insufficient documentation

## 2011-10-18 DIAGNOSIS — T82898A Other specified complication of vascular prosthetic devices, implants and grafts, initial encounter: Secondary | ICD-10-CM

## 2011-10-18 NOTE — Progress Notes (Signed)
[facility] [place]  Established Dialysis Access  History of Present Illness  [name] is a 55 y.o. female who presents for re-evaluation for permanent access.  The patient is right hand dominant.  Previous access procedures have been completed in the left arm.  The patient's complication from previous access procedures include: thrombosis.  Most recently underwent an attempt of thrombolysis of the left forearm arteriovenous graft but IR was unable to cross the venous anastomosis so the procedure was abandoned.  The patient has never been on HD.  She has completed her renal transplant evaluation at this point.    Past Medical History, Past Surgical History, Social History, Family History, Medications, Allergies, and Review of Systems are unchanged from previous visit on 05/14/11.  Physical Examination  Filed Vitals:   10/18/11 1338  BP: 130/81  Pulse: 85  Resp: 16  Height: 5' 1.5" (1.562 m)  Weight: 206 lb (93.441 kg)  SpO2: 99%   Body mass index is 38.29 kg/(m^2).   General: A&O x 3, WDWN, obese  Pulmonary: Sym exp, good air movt, CTAB, no rales, rhonchi, & wheezing  Cardiac: RRR, Nl S1, S2, no Murmurs, rubs or gallops  Gastrointestinal: soft, NTND, -G/R, - HSM, - masses, - CVAT B  Musculoskeletal: M/S 5/5 throughout , Extremities without  ischemic changes , no thrill or bruit in left forearm arteriovenous graft   Neurologic: CN 2-12 intact , Pain and light touch intact in extremities , Motor exam as listed above  Medical Decision Making  [name] is a 55 y.o. female who presents with CKD Stage V: not currently requiring hemodialysis.   The next step would be a L arm and central venogram which is scheduled for the 15 DEC 12 to see if she has adequate venous outflow for a LUA AVG.  Unfortunately, her R arm vein mapping is poor.  Only 20-30 cc of contrast is needed at most to interrogate the left arm and central veins, so I don't  anticipate this being enough to tip her into end stage renal disease.  She will be seeing her nephrologist on Monday, so if he or she disagrees we can always cancel the procedure.  Leonides Sake, MD [facility] [place] Office: [phone] Pager: [phone]  10/18/2011, 5:51 PM

## 2011-10-24 ENCOUNTER — Encounter (HOSPITAL_COMMUNITY)
Admission: RE | Admit: 2011-10-24 | Discharge: 2011-10-24 | Disposition: A | Discharge status: Home / Self Care | Payer: 59 | Source: Ambulatory Visit | Attending: Nephrology | Admitting: Nephrology

## 2011-10-24 DIAGNOSIS — N185 Chronic kidney disease, stage 5: Secondary | ICD-10-CM | POA: Insufficient documentation

## 2011-10-24 DIAGNOSIS — D638 Anemia in other chronic diseases classified elsewhere: Secondary | ICD-10-CM | POA: Insufficient documentation

## 2011-10-24 LAB — IRON AND TIBC: Iron: 43 ug/dL (ref 42–135)

## 2011-10-24 MED ORDER — EPOETIN ALFA 10000 UNIT/ML IJ SOLN: 40000.0000 [IU] | INTRAMUSCULAR | Status: DC

## 2011-10-24 MED ORDER — EPOETIN ALFA 40000 UNIT/ML IJ SOLN
INTRAMUSCULAR | Status: AC
  Administered 2011-10-24: 40000 [IU] via SUBCUTANEOUS
  Filled 2011-10-24: qty 1

## 2011-10-29 ENCOUNTER — Other Ambulatory Visit: Payer: Self-pay | Admitting: *Deleted

## 2011-10-30 ENCOUNTER — Encounter (HOSPITAL_COMMUNITY): Payer: Self-pay | Admitting: Pharmacist

## 2011-10-30 MED ORDER — SODIUM CHLORIDE 0.9 % IV SOLN: INTRAVENOUS | Status: DC

## 2011-10-31 ENCOUNTER — Ambulatory Visit (HOSPITAL_COMMUNITY)
Admission: RE | Admit: 2011-10-31 | Discharge: 2011-10-31 | Disposition: A | Discharge status: Home / Self Care | Payer: 59 | Source: Ambulatory Visit | Attending: Nephrology | Admitting: Nephrology

## 2011-10-31 ENCOUNTER — Other Ambulatory Visit: Payer: Self-pay

## 2011-10-31 ENCOUNTER — Other Ambulatory Visit: Payer: Self-pay | Admitting: *Deleted

## 2011-10-31 ENCOUNTER — Encounter (HOSPITAL_COMMUNITY)
Admission: RE | Disposition: A | Discharge status: Home / Self Care | Payer: Self-pay | Source: Ambulatory Visit | Attending: Vascular Surgery

## 2011-10-31 ENCOUNTER — Encounter (HOSPITAL_COMMUNITY): Payer: Self-pay | Admitting: Pharmacy Technician

## 2011-10-31 ENCOUNTER — Ambulatory Visit (HOSPITAL_COMMUNITY)
Admission: RE | Admit: 2011-10-31 | Discharge: 2011-10-31 | Disposition: A | Discharge status: Home / Self Care | Payer: 59 | Source: Ambulatory Visit | Attending: Vascular Surgery | Admitting: Vascular Surgery

## 2011-10-31 DIAGNOSIS — G4733 Obstructive sleep apnea (adult) (pediatric): Secondary | ICD-10-CM | POA: Insufficient documentation

## 2011-10-31 DIAGNOSIS — N186 End stage renal disease: Secondary | ICD-10-CM | POA: Insufficient documentation

## 2011-10-31 DIAGNOSIS — M329 Systemic lupus erythematosus, unspecified: Secondary | ICD-10-CM | POA: Insufficient documentation

## 2011-10-31 DIAGNOSIS — Z86718 Personal history of other venous thrombosis and embolism: Secondary | ICD-10-CM | POA: Insufficient documentation

## 2011-10-31 DIAGNOSIS — Z992 Dependence on renal dialysis: Secondary | ICD-10-CM | POA: Insufficient documentation

## 2011-10-31 DIAGNOSIS — Z9884 Bariatric surgery status: Secondary | ICD-10-CM | POA: Insufficient documentation

## 2011-10-31 HISTORY — PX: VENOGRAM: SHX5497

## 2011-10-31 SURGERY — VENOGRAM
Anesthesia: LOCAL

## 2011-10-31 MED ORDER — SODIUM CHLORIDE 0.9 % IV SOLN: INTRAVENOUS | Status: DC

## 2011-10-31 MED ORDER — EPOETIN ALFA 10000 UNIT/ML IJ SOLN
40000.0000 [IU] | INTRAMUSCULAR | Status: DC
  Administered 2011-10-31: 40000 [IU] via SUBCUTANEOUS

## 2011-10-31 MED ORDER — ACETAMINOPHEN 325 MG PO TABS: 650.0000 mg | ORAL_TABLET | ORAL | Status: DC | PRN

## 2011-10-31 MED ORDER — SODIUM CHLORIDE 0.9 % IV SOLN: INTRAVENOUS | Status: AC

## 2011-10-31 MED ORDER — SODIUM CHLORIDE 0.9 % IV SOLN: 1.0000 mL/kg/h | INTRAVENOUS | Status: DC

## 2011-10-31 MED ORDER — EPOETIN ALFA 40000 UNIT/ML IJ SOLN
INTRAMUSCULAR | Status: AC
  Filled 2011-10-31: qty 1

## 2011-10-31 MED ORDER — ONDANSETRON HCL 4 MG/2ML IJ SOLN: 4.0000 mg | Freq: Four times a day (QID) | INTRAMUSCULAR | Status: DC | PRN

## 2011-10-31 NOTE — H&P (Signed)
[facility] [place]  Brief Access History and Physical  History of Present Illness  [name] is a 55 y.o. female who presents with chief complaint: end stage renal disease.  The patient presents today for L arm venogram and central venogram.    Past Medical History  Diagnosis Date  . Lupus lupus with nephritis  . Chronic kidney disease   . Sleep apnea, obstructive   . Morbid obesity   . DVT (deep venous thrombosis)   . Gout   . Anemia   . Tachycardia   . GI (gastrointestinal bleed)   . Hyperlipidemia   . Gastric bypass status for obesity     Past Surgical History  Procedure Date  . Gastric bypass   . Inferior vena cava angioplasty / stenting   . Abdominal hysterectomy   . Laparoscopic gastric bypass 2010    History   Social History  . Marital Status: Single    Spouse Name: N/A    Number of Children: N/A  . Years of Education: N/A   Occupational History  . Not on file.   Social History Main Topics  . Smoking status: Former Smoker -- 0.2 packs/day for 20 years    Types: Cigarettes    Quit date: 05/15/2011  . Smokeless tobacco: Never Used  . Alcohol Use: No  . Drug Use: No  . Sexually Active: Not on file   Other Topics Concern  . Not on file   Social History Narrative  . No narrative on file    No family history on file.  No current facility-administered medications on file prior to encounter.   Current Outpatient Prescriptions on File Prior to Encounter  Medication Sig Dispense Refill  . allopurinol (ZYLOPRIM) 300 MG tablet Take 150 mg by mouth daily.       . calcium acetate (PHOSLO) 667 MG capsule Take 667 mg by mouth 2 (two) times daily with a meal.       . cyclobenzaprine (FLEXERIL) 10 MG tablet Take 10 mg by mouth 2 (two) times daily as needed. Muscle spasms      . furosemide (LASIX) 80 MG tablet Take 80 mg by mouth daily.       Marland Kitchen HECTOROL 1 MCG CAPS Take 1 mcg by mouth 2 (two) times daily.       Marland Kitchen  HYDROcodone-acetaminophen (NORCO) 5-325 MG per tablet Take 1 tablet by mouth every 4 (four) hours as needed. For pain      . metoprolol succinate (TOPROL-XL) 25 MG 24 hr tablet Take 25 mg by mouth at bedtime.       . pantoprazole (PROTONIX) 40 MG tablet Take 40 mg by mouth daily.       . predniSONE (DELTASONE) 5 MG tablet Take 7.5 mg by mouth daily.       . sodium bicarbonate 650 MG tablet Take 1,300 mg by mouth 2 (two) times daily.         No Known Allergies  Review of Systems: As listed above, otherwise negative.  Physical Examination  There were no vitals filed for this visit. There is no height or weight on file to calculate BMI.  General: A&O x 3, WDWN  Pulmonary: Sym exp, good air movt, CTAB, no rales, rhonchi, & wheezing  Cardiac: RRR, Nl S1, S2, no Murmurs, rubs or gallops  Gastrointestinal: soft, NTND, -G/R, - HSM, - masses, - CVAT B  Musculoskeletal: M/S 5/5 throughout , Extremities without ischemic changes   Laboratory  Component Value Date/Time   NA 145 07/30/2011 0203   K 5.2* 10/04/2011 0948   CL 103 07/30/2011 0203   CO2 27 07/30/2011 0203   GLUCOSE 144* 07/30/2011 0203   BUN 70* 07/30/2011 0203   CREATININE 5.84* 07/30/2011 0203   CALCIUM 9.8 07/30/2011 0203   GFRNONAA 8* 07/30/2011 0203   GFRAA 9* 07/30/2011 0203   Medical Decision Making  [name] is a 55 y.o. female who presents with: end stage renal disease.   The patient is scheduled for:  L arm venogram and central venogram.  I discussed with the patient the nature of angiographic procedures, especially the limited patencies of any endovascular intervention.    The patient is aware of that the risks of an angiographic procedure include but are not limited to: bleeding, infection, access site complications, renal failure, embolization, rupture of vessel, dissection, possible need for emergent surgical intervention, possible need for surgical procedures to treat the patient's pathology, and stroke  and death.    The patient is aware of the risks and agrees to proceed.  Leonides Sake, MD [facility] [place] Office: [phone] Pager: [phone]

## 2011-10-31 NOTE — Progress Notes (Signed)
Dr. Imogene Burn paged regarding post procedure orders not being applicable to procedure patient had and he wishes orders to be done as ordered.

## 2011-10-31 NOTE — Op Note (Addendum)
OPERATIVE NOTE   PROCEDURE: 1. left arm and central venogram   PRE-OPERATIVE DIAGNOSIS: end stage renal disease  POST-OPERATIVE DIAGNOSIS: same as above   SURGEON: Leonides Sake, MD  ANESTHESIA: local  ESTIMATED BLOOD LOSS: 5 cc  FINDING(S): 1. Large patent left high brachial vein  2. Small cephalic vein fills confluence and backfills brachial vein 3. Patent left axillary, subclavian, and innominate veins 4. Patent superior vena cava   SPECIMEN(S):  None  CONTRAST: 30 cc  INDICATIONS: [name] is a 55 y.o. female who  presents with chronic kidney disease stage 5.  The patient is scheduled for left venogram to help determine the availability of proximal veins for permanent access placement.  The patient is aware the risks include but are not limited to: bleeding, infection, thrombosis of the cannulated access, and possible anaphylactic reaction to the contrast.  The patient is aware of the risks of the procedure and elects to proceed forward.  DESCRIPTION: After full informed written consent was obtained, the patient was brought back to the angiography suite and placed supine upon the angiography table.  The patient was connected to monitoring equipment.  The left forearm IV was connected to IV extension tubing.  Hand injections were completed to image the arm veins and central venous structures, the findings of which are listed above.  Based on these findings, she should have a left upper arm arteriovenous graft.  Note, prior to commencing the case, she had IV hydration and after the case she also receive IV hydration per instructions from Nephrology.  COMPLICATIONS: none  CONDITION: stable  Leonides Sake, MD [facility] [place] Office: [phone] Pager: [phone]  10/31/2011 9:36 AM

## 2011-11-01 LAB — POCT I-STAT, CHEM 8
BUN: 91 mg/dL — ABNORMAL HIGH (ref 6–23)
Calcium, Ion: 0.96 mmol/L — ABNORMAL LOW (ref 1.12–1.32)
Chloride: 107 mEq/L (ref 96–112)
HCT: 35 % — ABNORMAL LOW (ref 36.0–46.0)
Potassium: 4.4 mEq/L (ref 3.5–5.1)

## 2011-11-01 LAB — POCT HEMOGLOBIN-HEMACUE: Hemoglobin: 11.6 g/dL — ABNORMAL LOW (ref 12.0–15.0)

## 2011-11-05 ENCOUNTER — Encounter (HOSPITAL_COMMUNITY): Payer: Self-pay

## 2011-11-05 ENCOUNTER — Encounter (HOSPITAL_COMMUNITY)
Admission: RE | Admit: 2011-11-05 | Discharge: 2011-11-05 | Disposition: A | Discharge status: Home / Self Care | Payer: 59 | Source: Ambulatory Visit | Attending: Vascular Surgery | Admitting: Vascular Surgery

## 2011-11-05 LAB — SURGICAL PCR SCREEN: Staphylococcus aureus: NEGATIVE

## 2011-11-05 MED ORDER — DEXTROSE 5 % IV SOLN
1.5000 g | INTRAVENOUS | Status: AC
  Administered 2011-11-06: 1.5 g via INTRAVENOUS
  Filled 2011-11-05: qty 1.5

## 2011-11-05 MED ORDER — SODIUM CHLORIDE 0.9 % IV SOLN
INTRAVENOUS | Status: DC
  Administered 2011-11-06: 13:00:00 via INTRAVENOUS

## 2011-11-05 NOTE — Pre-Procedure Instructions (Signed)
20 [name]  11/05/2011   Your procedure is scheduled on:  November 06, 2011  Report to [facility] at 08:30 AM.  Call this number if you have problems the morning of surgery: [phone]   Remember:   Do not eat food:After Midnight.  May have clear liquids: up to 4 Hours before arrival.  Clear liquids include soda, tea, black coffee, apple or grape juice, broth.  Take these medicines the morning of surgery with A SIP OF WATER: hydrocodone, protonix, prednisone   Do not wear jewelry, make-up or nail polish.  Do not wear lotions, powders, or perfumes. You may wear deodorant.  Do not shave 48 hours prior to surgery.  Do not bring valuables to the hospital.  Contacts, dentures or bridgework may not be worn into surgery.  Leave suitcase in the car. After surgery it may be brought to your room.  For patients admitted to the hospital, checkout time is 11:00 AM the day of discharge.   Patients discharged the day of surgery will not be allowed to drive home.  Name and phone number of your driver: Molly Maduro or Nyra Capes   Special Instructions: CHG Shower Use Special Wash: 1/2 bottle night before surgery and 1/2 bottle morning of surgery.   Please read over the following fact sheets that you were given: Pain Booklet, Coughing and Deep Breathing and Surgical Site Infection Prevention

## 2011-11-06 ENCOUNTER — Ambulatory Visit (HOSPITAL_COMMUNITY): Payer: 59 | Admitting: Anesthesiology

## 2011-11-06 ENCOUNTER — Encounter (HOSPITAL_COMMUNITY)
Admission: RE | Disposition: A | Discharge status: Home / Self Care | Payer: Self-pay | Source: Ambulatory Visit | Attending: Vascular Surgery

## 2011-11-06 ENCOUNTER — Ambulatory Visit (HOSPITAL_COMMUNITY)
Admission: RE | Admit: 2011-11-06 | Discharge: 2011-11-06 | Disposition: A | Discharge status: Home / Self Care | Payer: 59 | Source: Ambulatory Visit | Attending: Vascular Surgery | Admitting: Vascular Surgery

## 2011-11-06 ENCOUNTER — Encounter (HOSPITAL_COMMUNITY): Payer: Self-pay | Admitting: Anesthesiology

## 2011-11-06 DIAGNOSIS — N185 Chronic kidney disease, stage 5: Secondary | ICD-10-CM

## 2011-11-06 DIAGNOSIS — I120 Hypertensive chronic kidney disease with stage 5 chronic kidney disease or end stage renal disease: Secondary | ICD-10-CM | POA: Insufficient documentation

## 2011-11-06 DIAGNOSIS — G4730 Sleep apnea, unspecified: Secondary | ICD-10-CM | POA: Insufficient documentation

## 2011-11-06 DIAGNOSIS — E119 Type 2 diabetes mellitus without complications: Secondary | ICD-10-CM | POA: Insufficient documentation

## 2011-11-06 HISTORY — PX: AV FISTULA PLACEMENT: SHX1204

## 2011-11-06 LAB — POCT I-STAT 4, (NA,K, GLUC, HGB,HCT)
HCT: 37 % (ref 36.0–46.0)
Sodium: 138 mEq/L (ref 135–145)

## 2011-11-06 SURGERY — INSERTION OF ARTERIOVENOUS (AV) GORE-TEX GRAFT ARM
Anesthesia: Monitor Anesthesia Care | Site: Arm Upper | Laterality: Left | Wound class: Clean

## 2011-11-06 MED ORDER — THROMBIN 20000 UNITS EX KIT
PACK | CUTANEOUS | Status: DC | PRN
  Administered 2011-11-06: 14:00:00 via TOPICAL

## 2011-11-06 MED ORDER — HYDROMORPHONE HCL PF 1 MG/ML IJ SOLN
0.2500 mg | INTRAMUSCULAR | Status: DC | PRN
  Administered 2011-11-06: 0.25 mg via INTRAVENOUS
  Administered 2011-11-06: 0.5 mg via INTRAVENOUS

## 2011-11-06 MED ORDER — MEPERIDINE HCL 25 MG/ML IJ SOLN: 6.2500 mg | INTRAMUSCULAR | Status: DC | PRN

## 2011-11-06 MED ORDER — HEPARIN SODIUM (PORCINE) 1000 UNIT/ML IJ SOLN
INTRAMUSCULAR | Status: DC | PRN
  Administered 2011-11-06: 3000 [IU] via INTRAVENOUS

## 2011-11-06 MED ORDER — SODIUM CHLORIDE 0.9 % IR SOLN
Status: DC | PRN
  Administered 2011-11-06: 1

## 2011-11-06 MED ORDER — ROCURONIUM BROMIDE 100 MG/10ML IV SOLN
INTRAVENOUS | Status: DC | PRN
  Administered 2011-11-06: 40 mg via INTRAVENOUS
  Administered 2011-11-06 (×2): 10 mg via INTRAVENOUS

## 2011-11-06 MED ORDER — GLYCOPYRROLATE 0.2 MG/ML IJ SOLN
INTRAMUSCULAR | Status: DC | PRN
  Administered 2011-11-06: .6 mg via INTRAVENOUS

## 2011-11-06 MED ORDER — PROPOFOL 10 MG/ML IV BOLUS
INTRAVENOUS | Status: DC | PRN
  Administered 2011-11-06: 50 mg via INTRAVENOUS
  Administered 2011-11-06: 150 mg via INTRAVENOUS

## 2011-11-06 MED ORDER — OXYCODONE HCL 5 MG PO TABS: 5.0000 mg | ORAL_TABLET | ORAL | Status: DC | PRN

## 2011-11-06 MED ORDER — OXYCODONE HCL 5 MG PO TABS: 5.0000 mg | ORAL_TABLET | ORAL | Status: AC | PRN

## 2011-11-06 MED ORDER — FENTANYL CITRATE 0.05 MG/ML IJ SOLN
INTRAMUSCULAR | Status: DC | PRN
  Administered 2011-11-06 (×2): 100 ug via INTRAVENOUS
  Administered 2011-11-06: 25 ug via INTRAVENOUS

## 2011-11-06 MED ORDER — MIDAZOLAM HCL 5 MG/5ML IJ SOLN
INTRAMUSCULAR | Status: DC | PRN
  Administered 2011-11-06: 2 mg via INTRAVENOUS

## 2011-11-06 MED ORDER — SODIUM CHLORIDE 0.9 % IR SOLN
Status: DC | PRN
  Administered 2011-11-06: 13:00:00

## 2011-11-06 MED ORDER — ONDANSETRON HCL 4 MG/2ML IJ SOLN
INTRAMUSCULAR | Status: DC | PRN
  Administered 2011-11-06: 4 mg via INTRAVENOUS

## 2011-11-06 MED ORDER — PROMETHAZINE HCL 25 MG/ML IJ SOLN: 6.2500 mg | INTRAMUSCULAR | Status: DC | PRN

## 2011-11-06 MED ORDER — NEOSTIGMINE METHYLSULFATE 1 MG/ML IJ SOLN
INTRAMUSCULAR | Status: DC | PRN
  Administered 2011-11-06: 3 mg via INTRAVENOUS

## 2011-11-06 SURGICAL SUPPLY — 47 items
ADH SKN CLS APL DERMABOND .7 (GAUZE/BANDAGES/DRESSINGS) ×1
ADH SKN CLS LQ APL DERMABOND (GAUZE/BANDAGES/DRESSINGS) ×1
CANISTER SUCTION 2500CC (MISCELLANEOUS) ×2 IMPLANT
CLIP TI MEDIUM 6 (CLIP) ×2 IMPLANT
CLIP TI WIDE RED SMALL 6 (CLIP) ×2 IMPLANT
CLOTH BEACON ORANGE TIMEOUT ST (SAFETY) ×2 IMPLANT
COVER SURGICAL LIGHT HANDLE (MISCELLANEOUS) ×4 IMPLANT
DECANTER SPIKE VIAL GLASS SM (MISCELLANEOUS) ×2 IMPLANT
DERMABOND ADHESIVE PROPEN (GAUZE/BANDAGES/DRESSINGS) ×1
DERMABOND ADVANCED (GAUZE/BANDAGES/DRESSINGS) ×1
DERMABOND ADVANCED .7 DNX12 (GAUZE/BANDAGES/DRESSINGS) ×1 IMPLANT
DERMABOND ADVANCED .7 DNX6 (GAUZE/BANDAGES/DRESSINGS) IMPLANT
ELECT REM PT RETURN 9FT ADLT (ELECTROSURGICAL) ×2
ELECTRODE REM PT RTRN 9FT ADLT (ELECTROSURGICAL) ×1 IMPLANT
GLOVE BIO SURGEON STRL SZ 6.5 (GLOVE) ×1 IMPLANT
GLOVE BIO SURGEON STRL SZ7 (GLOVE) ×3 IMPLANT
GLOVE BIOGEL PI IND STRL 6.5 (GLOVE) IMPLANT
GLOVE BIOGEL PI IND STRL 7.0 (GLOVE) IMPLANT
GLOVE BIOGEL PI IND STRL 7.5 (GLOVE) ×1 IMPLANT
GLOVE BIOGEL PI INDICATOR 6.5 (GLOVE) ×1
GLOVE BIOGEL PI INDICATOR 7.0 (GLOVE) ×4
GLOVE BIOGEL PI INDICATOR 7.5 (GLOVE) ×1
GLOVE SS BIOGEL STRL SZ 7 (GLOVE) IMPLANT
GLOVE SUPERSENSE BIOGEL SZ 7 (GLOVE) ×1
GLOVE SURG SS PI 7.5 STRL IVOR (GLOVE) ×1 IMPLANT
GOWN STRL NON-REIN LRG LVL3 (GOWN DISPOSABLE) ×5 IMPLANT
GOWN STRL REIN XL XLG (GOWN DISPOSABLE) ×2 IMPLANT
GRAFT GORETEX STRT 4-7X45 (Vascular Products) ×1 IMPLANT
INSERT FOGARTY SM (MISCELLANEOUS) IMPLANT
KIT BASIN OR (CUSTOM PROCEDURE TRAY) ×2 IMPLANT
KIT ROOM TURNOVER OR (KITS) ×2 IMPLANT
NS IRRIG 1000ML POUR BTL (IV SOLUTION) ×2 IMPLANT
PACK CV ACCESS (CUSTOM PROCEDURE TRAY) ×2 IMPLANT
PAD ARMBOARD 7.5X6 YLW CONV (MISCELLANEOUS) ×4 IMPLANT
SPONGE SURGIFOAM ABS GEL 100 (HEMOSTASIS) ×1 IMPLANT
SUT MNCRL AB 4-0 PS2 18 (SUTURE) ×2 IMPLANT
SUT PROLENE 5 0 C 1 24 (SUTURE) IMPLANT
SUT PROLENE 6 0 BV (SUTURE) ×5 IMPLANT
SUT PROLENE 7 0 BV 1 (SUTURE) IMPLANT
SUT SILK 2 0 FS (SUTURE) ×2 IMPLANT
SUT VIC AB 3-0 SH 27 (SUTURE) ×4
SUT VIC AB 3-0 SH 27X BRD (SUTURE) ×2 IMPLANT
THROMBIN 20,000UNITS ×1 IMPLANT
TOWEL OR 17X24 6PK STRL BLUE (TOWEL DISPOSABLE) ×2 IMPLANT
TOWEL OR 17X26 10 PK STRL BLUE (TOWEL DISPOSABLE) ×2 IMPLANT
UNDERPAD 30X30 INCONTINENT (UNDERPADS AND DIAPERS) ×2 IMPLANT
WATER STERILE IRR 1000ML POUR (IV SOLUTION) ×2 IMPLANT

## 2011-11-06 NOTE — Anesthesia Preprocedure Evaluation (Addendum)
Anesthesia Evaluation  Patient identified by MRN, date of birth, ID band Patient awake    Reviewed: Allergy & Precautions, H&P , NPO status , Patient's Chart, lab work & pertinent test results, reviewed documented beta blocker date and time   History of Anesthesia Complications Negative for: history of anesthetic complications  Airway Mallampati: II  Neck ROM: Full    Dental  (+) Teeth Intact   Pulmonary sleep apnea ,  clear to auscultation        Cardiovascular hypertension, Pt. on medications + Valvular Problems/Murmurs Regular Normal    Neuro/Psych    GI/Hepatic Neg liver ROS,   Endo/Other  Diabetes mellitus-Morbid obesity  Renal/GU ESRFRenal diseaseNo HD     Musculoskeletal   Abdominal (+) obese,   Peds  Hematology SLE   Anesthesia Other Findings   Reproductive/Obstetrics                         Anesthesia Physical Anesthesia Plan  ASA: III  Anesthesia Plan: General   Post-op Pain Management:    Induction: Intravenous  Airway Management Planned: Oral ETT  Additional Equipment:   Intra-op Plan:   Post-operative Plan: Extubation in OR  Informed Consent: I have reviewed the patients History and Physical, chart, labs and discussed the procedure including the risks, benefits and alternatives for the proposed anesthesia with the patient or authorized representative who has indicated his/her understanding and acceptance.   Dental advisory given  Plan Discussed with: CRNA and Surgeon  Anesthesia Plan Comments:         Anesthesia Quick Evaluation

## 2011-11-06 NOTE — Anesthesia Procedure Notes (Addendum)
Procedure Name: Intubation Date/Time: 11/06/2011 12:57 PM Performed by: Gwenyth Allegra Pre-anesthesia Checklist: Patient identified, Timeout performed, Emergency Drugs available, Suction available and Patient being monitored Patient Re-evaluated:Patient Re-evaluated prior to inductionOxygen Delivery Method: Circle System Utilized Preoxygenation: Pre-oxygenation with 100% oxygen Intubation Type: IV induction Ventilation: Mask ventilation without difficulty Laryngoscope Size: Miller and 2 Tube type: Oral Tube size: 7.5 mm Number of attempts: 1 Airway Equipment and Method: stylet Secured at: 19 cm Tube secured with: Tape Dental Injury: Teeth and Oropharynx as per pre-operative assessment

## 2011-11-06 NOTE — Transfer of Care (Signed)
Immediate Anesthesia Transfer of Care Note  Patient: [name]  Procedure(s) Performed:  INSERTION OF ARTERIOVENOUS (AV) GORE-TEX GRAFT ARM  Patient Location: PACU  Anesthesia Type: General  Level of Consciousness: awake, oriented and patient cooperative  Airway & Oxygen Therapy: Patient Spontanous Breathing and Patient connected to nasal cannula oxygen  Post-op Assessment: Report given to PACU RN and Post -op Vital signs reviewed and stable  Post vital signs: Reviewed and stable  Complications: No apparent anesthesia complications

## 2011-11-06 NOTE — Interval H&P Note (Signed)
--    [facility]  History and Physical Update  The patient was interviewed and re-examined.  The patient's History and Physical has been reviewed and is unchanged except for: interval left arm venogram which demonstrates a L UA AVG is a viable option.  There is no change in the plan of care.  Leonides Sake, MD [facility] [place] Office: [phone] Pager: [phone]  11/06/2011, 8:47 AM

## 2011-11-06 NOTE — Op Note (Signed)
OPERATIVE NOTE   PROCEDURE:  left upper arm arteriovenous graft  PRE-OPERATIVE DIAGNOSIS: chronic kidney disease stage V   POST-OPERATIVE DIAGNOSIS: same as above  SURGEON: Leonides Sake, MD  ASSISTANT(S): Autumn Patty, PAC  ANESTHESIA: general  ESTIMATED BLOOD LOSS: 50 cc  FINDING(S): 1. Weak pulse in brachial artery 2. 6 mm high brachial vein 3. Weakly palpable thrill at end of case 4. Dopplerable left radial signal  SPECIMEN(S):  none  INDICATIONS:   [name] is a 55 y.o. female who  presents with chronic kidney disease stage V.  Her previous Left forearm arteriovenous graft has thrombosed.  She underwent left arm venogram and based on those images, I felt a left upper arm arteriovenous graft was possible.  Risk, benefits, and alternatives to access surgery were discussed.  The patient is aware the risks include but are not limited to: bleeding, infection, steal syndrome, nerve damage, ischemic monomelic neuropathy, failure to mature, and need for additional procedures.  The patient is aware of the risks and elects to proceed forward.  DESCRIPTION: After full informed written consent was obtained from the patient, the patient was brought back to the operating room and placed supine upon the operating table.  The patient was given IV antibiotics prior to proceeding.  After obtaining adequate sedation, the patient was prepped and draped in standard fashion for a left arm access procedure.  I turned my attention first to the antecubitum.  Under ultrasound guidance, I identified the location of the brachial artery and marked it on the skin.  I then examined the bicipital groove and identified the high brachial vein and marked it on the skin.  I made an incision over the brachial artery, and dissected down through the subcutaneous tissue to the fascia carefully and was able to dissect out the brachial artery.  The artery was about 2.5 mm externally.  It was controlled proximally  and distally with vessel loops and then I turned my attention to the high bicipital groove.  I made an incision at the previously anesthetized site, dissected down through the subcutaneous tissue and fascia until I reached the high brachial vein.  Externally, it appeared to be 6-7 mm in diameter.  I then dissected this vein proximally and distal.  I took a metal Gore tunneler and dissected from the antecubital incision up to the high bicipital incision.  Then I delivered the 4 x 7-mm stretch Gore-Tex graft, through this metal tunneler and then pulled out the metal tunneler leaving the graft in place.  The 4-mm end was left on the antecubital side and the 7 mm toward the axillary.  I then gave the patient 3000 units of heparin to gain some anticoagulation.  After waiting 3 minutes, I placed the brachial artery under tension proximally and distally with vessel loops, made an arteriotomy and extended it with a Potts scissor.  I sewed the 4-mm end of the graft to this arteriotomy with a running stitch of 6-0 Prolene.  At this point, then I completed the anastomosis in the usual fashion.  I released the vessel loops on the inflow and allowed the artery to decompress through the graft. There was good pulsatile bleeding through this graft.  I clamped the graft near its arterial anastomosis and sucked out all the blood in the graft and loaded the graft with heparinized saline.  At this point, I pulled the graft to appropriate length and reset my exposure of the high brachial vein.  I tied off the  high brachial vein distally with a 2-0 silk and then transected it.  I became evident after I transected the vein that in fact there was deeper branch in proximity to this vein.  I had adequate length on this branch of the brachial vein to use it as outflow.  After further exploration, I found a trifurcation of the high brachial veins.  I did not think this would change my plan.  I injected some heparinized saline into this vein  and then clamped it.  I cut the graft to appropriate length for this anastomosis.  This graft was sewn to the vein in an end-to-end configuration with a 6-0 Prolene.  Prior to completing this anastomosis, I allowed the vein to back bleed and then I also allowed the artery to bleed in an antegrade fashion.  I completed this anastomosis in the usual fashion, and then irrigated out the high bicipital exposure and then placed thrombin and Gelfoam.  I then turned my attention back to the antecubitum.  The distal radial pulse was not palpable but had a reasonable signal which did not augment with venous compression.  Using a Doppler and biphasic signal, the brachial artery proximally and distally had multiphasic waveforms.  The venous outflow had a flow signature consistent with widely patent arterial venous graft.  At this point, I washed out the antecubital incision.  There was no more active bleeding.  The subcutaneous tissue was reapproximated with a running stitch of 3-0 Vicryl.  The skin was then reapproximated with a running subcuticular 4-0 Monocryl.  The skin was then cleaned, dried, and Dermabond used to reinforce the skin closure.  We then turned our attention to the high bicipital exposure.  I removed all the thrombin and Gelfoam and washed out the wound.  There was no more active bleeding.  The subcutaneous tissue was repaired with running stitch of 3-0 Vicryl.  The skin was then reapproximated with running subcuticular 4-0 Monocryl.  The skin was then cleaned, dried, and then the skin closure was reinforced with Dermabond.    COMPLICATIONS: none  CONDITION: stable   Nilda Simmer, MD 11/06/2011 3:16 PM

## 2011-11-06 NOTE — Anesthesia Postprocedure Evaluation (Signed)
  Anesthesia Post-op Note  Patient: [name]  Procedure(s) Performed:  INSERTION OF ARTERIOVENOUS (AV) GORE-TEX GRAFT ARM  Patient Location: PACU  Anesthesia Type: General  Level of Consciousness: awake  Airway and Oxygen Therapy: Patient Spontanous Breathing  Post-op Pain: mild  Post-op Assessment: Post-op Vital signs reviewed  Post-op Vital Signs: stable  Complications: No apparent anesthesia complications

## 2011-11-06 NOTE — H&P (View-Only) (Signed)
[facility]  Brief Access History and Physical  History of Present Illness  [name] is a 55 y.o. female who presents with chief complaint: end stage renal disease.  The patient presents today for L arm venogram and central venogram.    Past Medical History  Diagnosis Date  . Lupus lupus with nephritis  . Chronic kidney disease   . Sleep apnea, obstructive   . Morbid obesity   . DVT (deep venous thrombosis)   . Gout   . Anemia   . Tachycardia   . GI (gastrointestinal bleed)   . Hyperlipidemia   . Gastric bypass status for obesity     Past Surgical History  Procedure Date  . Gastric bypass   . Inferior vena cava angioplasty / stenting   . Abdominal hysterectomy   . Laparoscopic gastric bypass 2010    History   Social History  . Marital Status: Single    Spouse Name: N/A    Number of Children: N/A  . Years of Education: N/A   Occupational History  . Not on file.   Social History Main Topics  . Smoking status: Former Smoker -- 0.2 packs/day for 20 years    Types: Cigarettes    Quit date: 05/15/2011  . Smokeless tobacco: Never Used  . Alcohol Use: No  . Drug Use: No  . Sexually Active: Not on file   Other Topics Concern  . Not on file   Social History Narrative  . No narrative on file    No family history on file.  No current facility-administered medications on file prior to encounter.   Current Outpatient Prescriptions on File Prior to Encounter  Medication Sig Dispense Refill  . allopurinol (ZYLOPRIM) 300 MG tablet Take 150 mg by mouth daily.       . calcium acetate (PHOSLO) 667 MG capsule Take 667 mg by mouth 2 (two) times daily with a meal.       . cyclobenzaprine (FLEXERIL) 10 MG tablet Take 10 mg by mouth 2 (two) times daily as needed. Muscle spasms      . furosemide (LASIX) 80 MG tablet Take 80 mg by mouth daily.       . HECTOROL 1 MCG CAPS Take 1 mcg by mouth 2 (two) times daily.       .  HYDROcodone-acetaminophen (NORCO) 5-325 MG per tablet Take 1 tablet by mouth every 4 (four) hours as needed. For pain      . metoprolol succinate (TOPROL-XL) 25 MG 24 hr tablet Take 25 mg by mouth at bedtime.       . pantoprazole (PROTONIX) 40 MG tablet Take 40 mg by mouth daily.       . predniSONE (DELTASONE) 5 MG tablet Take 7.5 mg by mouth daily.       . sodium bicarbonate 650 MG tablet Take 1,300 mg by mouth 2 (two) times daily.         No Known Allergies  Review of Systems: As listed above, otherwise negative.  Physical Examination  There were no vitals filed for this visit. There is no height or weight on file to calculate BMI.  General: A&O x 3, WDWN  Pulmonary: Sym exp, good air movt, CTAB, no rales, rhonchi, & wheezing  Cardiac: RRR, Nl S1, S2, no Murmurs, rubs or gallops  Gastrointestinal: soft, NTND, -G/R, - HSM, - masses, - CVAT B  Musculoskeletal: M/S 5/5 throughout , Extremities without ischemic changes   Laboratory      Component Value Date/Time   NA 145 07/30/2011 0203   K 5.2* 10/04/2011 0948   CL 103 07/30/2011 0203   CO2 27 07/30/2011 0203   GLUCOSE 144* 07/30/2011 0203   BUN 70* 07/30/2011 0203   CREATININE 5.84* 07/30/2011 0203   CALCIUM 9.8 07/30/2011 0203   GFRNONAA 8* 07/30/2011 0203   GFRAA 9* 07/30/2011 0203   Medical Decision Making  [name] is a 55 y.o. female who presents with: end stage renal disease.   The patient is scheduled for:  L arm venogram and central venogram.  I discussed with the patient the nature of angiographic procedures, especially the limited patencies of any endovascular intervention.    The patient is aware of that the risks of an angiographic procedure include but are not limited to: bleeding, infection, access site complications, renal failure, embolization, rupture of vessel, dissection, possible need for emergent surgical intervention, possible need for surgical procedures to treat the patient's pathology, and stroke  and death.    The patient is aware of the risks and agrees to proceed.  Brian Chen, MD [facility] Office: [phone] Pager: [phone]    

## 2011-11-07 ENCOUNTER — Encounter (HOSPITAL_COMMUNITY): Payer: Self-pay | Admitting: Vascular Surgery

## 2011-11-07 ENCOUNTER — Encounter (HOSPITAL_COMMUNITY): Payer: 59

## 2011-11-14 ENCOUNTER — Encounter (HOSPITAL_COMMUNITY): Payer: 59

## 2011-11-18 ENCOUNTER — Encounter: Payer: Self-pay | Admitting: Physician Assistant

## 2011-11-20 ENCOUNTER — Encounter: Payer: Self-pay | Admitting: Physician Assistant

## 2011-11-20 ENCOUNTER — Ambulatory Visit (INDEPENDENT_AMBULATORY_CARE_PROVIDER_SITE_OTHER): Payer: 59 | Admitting: Physician Assistant

## 2011-11-20 VITALS — BP 139/77 | HR 96 | Resp 20 | Ht 61.5 in | Wt 212.7 lb

## 2011-11-20 DIAGNOSIS — N186 End stage renal disease: Secondary | ICD-10-CM

## 2011-11-20 NOTE — Progress Notes (Signed)
[facility] Postoperative Visit hemodialysis access   Date of Surgery:  11/06/11 Surgeon: Imogene Burn HD:  no HD:  Days: NA  CC: f/u for LUA AVG placement  HPI:  This is a 56 y.o. female who returns today s/p LUA AVG.  She is doing well and not having any problems.  She denies any Sx of steal.  PHYSICAL EXAMINATION:  Filed Vitals:   11/20/11 1400  BP: 139/77  Pulse: 96  Resp: 20     Incision is c/d/i. Hand grip is equal bilaterally and sensation in digits is intact; There is  Thrill; there is bruit. The graft/fistula is easily palpable  Pulse:  +palpable left radial pulse  ASSESSMENT/PLAN:  [name] is a 56 y.o. year old female who presents s/p LUA AVG -pt is doing well without Sx of steal. -she is not yet on HD.   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  [name], PA-C [facility] [phone]  Clinic MD:   Edilia Bo

## 2011-11-25 ENCOUNTER — Encounter (HOSPITAL_COMMUNITY)
Admission: RE | Admit: 2011-11-25 | Discharge: 2011-11-25 | Disposition: A | Discharge status: Home / Self Care | Payer: 59 | Source: Ambulatory Visit | Attending: Nephrology | Admitting: Nephrology

## 2011-11-25 ENCOUNTER — Other Ambulatory Visit (HOSPITAL_COMMUNITY): Payer: Self-pay | Admitting: *Deleted

## 2011-11-25 DIAGNOSIS — N185 Chronic kidney disease, stage 5: Secondary | ICD-10-CM | POA: Insufficient documentation

## 2011-11-25 DIAGNOSIS — D638 Anemia in other chronic diseases classified elsewhere: Secondary | ICD-10-CM | POA: Insufficient documentation

## 2011-11-25 LAB — IRON AND TIBC
Iron: 72 ug/dL (ref 42–135)
Saturation Ratios: 35 % (ref 20–55)
TIBC: 203 ug/dL — ABNORMAL LOW (ref 250–470)

## 2011-11-25 MED ORDER — EPOETIN ALFA 10000 UNIT/ML IJ SOLN: 40000.0000 [IU] | INTRAMUSCULAR | Status: DC

## 2011-11-25 MED ORDER — FERUMOXYTOL INJECTION 510 MG/17 ML
510.0000 mg | Freq: Once | INTRAVENOUS | Status: AC
  Administered 2011-11-25: 510 mg via INTRAVENOUS

## 2011-11-25 MED ORDER — FERUMOXYTOL INJECTION 510 MG/17 ML
INTRAVENOUS | Status: AC
  Administered 2011-11-25: 510 mg via INTRAVENOUS
  Filled 2011-11-25: qty 17

## 2011-11-25 MED ORDER — EPOETIN ALFA 40000 UNIT/ML IJ SOLN
INTRAMUSCULAR | Status: AC
  Administered 2011-11-25: 40000 [IU] via SUBCUTANEOUS
  Filled 2011-11-25: qty 1

## 2011-12-06 ENCOUNTER — Ambulatory Visit: Payer: 59 | Admitting: Vascular Surgery

## 2011-12-12 ENCOUNTER — Encounter (HOSPITAL_COMMUNITY)
Admission: RE | Admit: 2011-12-12 | Discharge: 2011-12-12 | Disposition: A | Discharge status: Home / Self Care | Payer: 59 | Source: Ambulatory Visit | Attending: Nephrology | Admitting: Nephrology

## 2011-12-12 MED ORDER — EPOETIN ALFA 10000 UNIT/ML IJ SOLN: 40000.0000 [IU] | INTRAMUSCULAR | Status: DC

## 2011-12-26 ENCOUNTER — Encounter (HOSPITAL_COMMUNITY)
Admission: RE | Admit: 2011-12-26 | Discharge: 2011-12-26 | Disposition: A | Discharge status: Home / Self Care | Payer: 59 | Source: Ambulatory Visit | Attending: Nephrology | Admitting: Nephrology

## 2011-12-26 DIAGNOSIS — D638 Anemia in other chronic diseases classified elsewhere: Secondary | ICD-10-CM | POA: Insufficient documentation

## 2011-12-26 DIAGNOSIS — N185 Chronic kidney disease, stage 5: Secondary | ICD-10-CM | POA: Insufficient documentation

## 2011-12-26 LAB — FERRITIN: Ferritin: 874 ng/mL — ABNORMAL HIGH (ref 10–291)

## 2011-12-26 LAB — IRON AND TIBC
Saturation Ratios: 32 % (ref 20–55)
TIBC: 222 ug/dL — ABNORMAL LOW (ref 250–470)
UIBC: 151 ug/dL (ref 125–400)

## 2011-12-26 MED ORDER — EPOETIN ALFA 10000 UNIT/ML IJ SOLN: 40000.0000 [IU] | INTRAMUSCULAR | Status: DC

## 2011-12-26 MED ORDER — EPOETIN ALFA 40000 UNIT/ML IJ SOLN
INTRAMUSCULAR | Status: AC
  Administered 2011-12-26: 40000 [IU] via SUBCUTANEOUS
  Filled 2011-12-26: qty 1

## 2012-01-07 ENCOUNTER — Other Ambulatory Visit (HOSPITAL_COMMUNITY): Payer: Self-pay | Admitting: *Deleted

## 2012-01-09 ENCOUNTER — Encounter (HOSPITAL_COMMUNITY)
Admission: RE | Admit: 2012-01-09 | Discharge: 2012-01-09 | Disposition: A | Discharge status: Home / Self Care | Payer: 59 | Source: Ambulatory Visit | Attending: Nephrology | Admitting: Nephrology

## 2012-01-09 LAB — POCT HEMOGLOBIN-HEMACUE: Hemoglobin: 12.1 g/dL (ref 12.0–15.0)

## 2012-01-09 MED ORDER — EPOETIN ALFA 10000 UNIT/ML IJ SOLN: 20000.0000 [IU] | INTRAMUSCULAR | Status: DC

## 2012-01-23 ENCOUNTER — Encounter (HOSPITAL_COMMUNITY): Payer: 59

## 2012-02-06 ENCOUNTER — Encounter (HOSPITAL_COMMUNITY): Payer: 59 | Attending: Nephrology

## 2012-02-06 DIAGNOSIS — D638 Anemia in other chronic diseases classified elsewhere: Secondary | ICD-10-CM | POA: Insufficient documentation

## 2012-02-06 DIAGNOSIS — N185 Chronic kidney disease, stage 5: Secondary | ICD-10-CM | POA: Insufficient documentation

## 2012-03-25 ENCOUNTER — Other Ambulatory Visit (HOSPITAL_COMMUNITY): Payer: Self-pay | Admitting: *Deleted

## 2012-03-26 ENCOUNTER — Encounter (HOSPITAL_COMMUNITY)
Admission: RE | Admit: 2012-03-26 | Discharge: 2012-03-26 | Disposition: A | Discharge status: Home / Self Care | Payer: 59 | Source: Ambulatory Visit | Attending: Nephrology | Admitting: Nephrology

## 2012-03-26 DIAGNOSIS — N185 Chronic kidney disease, stage 5: Secondary | ICD-10-CM | POA: Insufficient documentation

## 2012-03-26 DIAGNOSIS — D638 Anemia in other chronic diseases classified elsewhere: Secondary | ICD-10-CM | POA: Insufficient documentation

## 2012-03-26 LAB — IRON AND TIBC
Iron: 43 ug/dL (ref 42–135)
Saturation Ratios: 20 % (ref 20–55)
UIBC: 168 ug/dL (ref 125–400)

## 2012-03-26 MED ORDER — EPOETIN ALFA 10000 UNIT/ML IJ SOLN: 20000.0000 [IU] | INTRAMUSCULAR | Status: DC

## 2012-03-26 MED ORDER — EPOETIN ALFA 20000 UNIT/ML IJ SOLN
INTRAMUSCULAR | Status: AC
  Administered 2012-03-26: 20000 [IU] via SUBCUTANEOUS
  Filled 2012-03-26: qty 1

## 2012-03-27 LAB — FERRITIN: Ferritin: 739 ng/mL — ABNORMAL HIGH (ref 10–291)

## 2012-03-30 ENCOUNTER — Encounter (INDEPENDENT_AMBULATORY_CARE_PROVIDER_SITE_OTHER): Payer: Self-pay

## 2012-04-09 ENCOUNTER — Encounter (HOSPITAL_COMMUNITY)
Admission: RE | Admit: 2012-04-09 | Discharge: 2012-04-09 | Disposition: A | Discharge status: Home / Self Care | Payer: 59 | Source: Ambulatory Visit | Attending: Nephrology | Admitting: Nephrology

## 2012-04-09 MED ORDER — EPOETIN ALFA 20000 UNIT/ML IJ SOLN
INTRAMUSCULAR | Status: AC
  Administered 2012-04-09: 20000 [IU] via SUBCUTANEOUS
  Filled 2012-04-09: qty 1

## 2012-04-09 MED ORDER — EPOETIN ALFA 10000 UNIT/ML IJ SOLN: 20000.0000 [IU] | INTRAMUSCULAR | Status: DC

## 2012-04-23 ENCOUNTER — Encounter (HOSPITAL_COMMUNITY)
Admission: RE | Admit: 2012-04-23 | Discharge: 2012-04-23 | Disposition: A | Discharge status: Home / Self Care | Payer: 59 | Source: Ambulatory Visit | Attending: Nephrology | Admitting: Nephrology

## 2012-04-23 DIAGNOSIS — D638 Anemia in other chronic diseases classified elsewhere: Secondary | ICD-10-CM | POA: Insufficient documentation

## 2012-04-23 DIAGNOSIS — N185 Chronic kidney disease, stage 5: Secondary | ICD-10-CM | POA: Insufficient documentation

## 2012-04-23 LAB — IRON AND TIBC
Saturation Ratios: 16 % — ABNORMAL LOW (ref 20–55)
UIBC: 176 ug/dL (ref 125–400)

## 2012-04-23 LAB — FERRITIN: Ferritin: 603 ng/mL — ABNORMAL HIGH (ref 10–291)

## 2012-04-23 MED ORDER — EPOETIN ALFA 10000 UNIT/ML IJ SOLN: 20000.0000 [IU] | INTRAMUSCULAR | Status: DC

## 2012-04-23 MED ORDER — EPOETIN ALFA 20000 UNIT/ML IJ SOLN
INTRAMUSCULAR | Status: AC
  Administered 2012-04-23: 20000 [IU] via SUBCUTANEOUS
  Filled 2012-04-23: qty 1

## 2012-05-07 ENCOUNTER — Encounter (HOSPITAL_COMMUNITY)
Admission: RE | Admit: 2012-05-07 | Discharge: 2012-05-07 | Disposition: A | Discharge status: Home / Self Care | Payer: 59 | Source: Ambulatory Visit | Attending: Nephrology | Admitting: Nephrology

## 2012-05-07 LAB — POCT HEMOGLOBIN-HEMACUE: Hemoglobin: 11.2 g/dL — ABNORMAL LOW (ref 12.0–15.0)

## 2012-05-07 MED ORDER — EPOETIN ALFA 10000 UNIT/ML IJ SOLN: 20000.0000 [IU] | INTRAMUSCULAR | Status: DC

## 2012-05-07 MED ORDER — EPOETIN ALFA 20000 UNIT/ML IJ SOLN
INTRAMUSCULAR | Status: AC
  Administered 2012-05-07: 20000 [IU] via SUBCUTANEOUS
  Filled 2012-05-07: qty 1

## 2012-05-18 ENCOUNTER — Other Ambulatory Visit (HOSPITAL_COMMUNITY): Payer: Self-pay | Admitting: *Deleted

## 2012-05-20 ENCOUNTER — Encounter (HOSPITAL_COMMUNITY): Payer: 59

## 2012-05-20 ENCOUNTER — Encounter (HOSPITAL_COMMUNITY)
Admission: RE | Admit: 2012-05-20 | Discharge: 2012-05-20 | Disposition: A | Discharge status: Home / Self Care | Payer: 59 | Source: Ambulatory Visit | Attending: Nephrology | Admitting: Nephrology

## 2012-05-20 DIAGNOSIS — D638 Anemia in other chronic diseases classified elsewhere: Secondary | ICD-10-CM | POA: Insufficient documentation

## 2012-05-20 DIAGNOSIS — N185 Chronic kidney disease, stage 5: Secondary | ICD-10-CM | POA: Insufficient documentation

## 2012-05-20 MED ORDER — SODIUM CHLORIDE 0.9 % IV SOLN
1020.0000 mg | Freq: Once | INTRAVENOUS | Status: DC
  Filled 2012-05-20: qty 34

## 2012-05-20 MED ORDER — EPOETIN ALFA 20000 UNIT/ML IJ SOLN
INTRAMUSCULAR | Status: AC
  Administered 2012-05-20: 20000 [IU] via SUBCUTANEOUS
  Filled 2012-05-20: qty 1

## 2012-05-20 MED ORDER — EPOETIN ALFA 10000 UNIT/ML IJ SOLN: 20000.0000 [IU] | INTRAMUSCULAR | Status: DC

## 2012-05-20 MED ORDER — FERUMOXYTOL INJECTION 510 MG/17 ML
510.0000 mg | Freq: Once | INTRAVENOUS | Status: AC
  Administered 2012-05-20: 510 mg via INTRAVENOUS
  Filled 2012-05-20: qty 17

## 2012-05-20 MED ORDER — SODIUM CHLORIDE 0.9 % IV SOLN
Freq: Once | INTRAVENOUS | Status: AC
  Administered 2012-05-20: 15:00:00 via INTRAVENOUS

## 2012-05-21 LAB — IRON AND TIBC
Iron: 46 ug/dL (ref 42–135)
UIBC: 164 ug/dL (ref 125–400)

## 2012-05-21 LAB — FERRITIN: Ferritin: 368 ng/mL — ABNORMAL HIGH (ref 10–291)

## 2012-06-04 ENCOUNTER — Encounter (HOSPITAL_COMMUNITY)
Admission: RE | Admit: 2012-06-04 | Discharge: 2012-06-04 | Disposition: A | Discharge status: Home / Self Care | Payer: 59 | Source: Ambulatory Visit | Attending: Nephrology | Admitting: Nephrology

## 2012-06-04 MED ORDER — EPOETIN ALFA 10000 UNIT/ML IJ SOLN: 20000.0000 [IU] | INTRAMUSCULAR | Status: DC

## 2012-06-18 ENCOUNTER — Encounter (HOSPITAL_COMMUNITY): Payer: 59

## 2012-07-02 ENCOUNTER — Encounter (HOSPITAL_COMMUNITY)
Admission: RE | Admit: 2012-07-02 | Discharge: 2012-07-02 | Disposition: A | Discharge status: Home / Self Care | Payer: 59 | Source: Ambulatory Visit | Attending: Nephrology | Admitting: Nephrology

## 2012-07-02 DIAGNOSIS — N185 Chronic kidney disease, stage 5: Secondary | ICD-10-CM | POA: Insufficient documentation

## 2012-07-02 DIAGNOSIS — D638 Anemia in other chronic diseases classified elsewhere: Secondary | ICD-10-CM | POA: Insufficient documentation

## 2012-07-02 LAB — POCT HEMOGLOBIN-HEMACUE: Hemoglobin: 11.6 g/dL — ABNORMAL LOW (ref 12.0–15.0)

## 2012-07-02 MED ORDER — EPOETIN ALFA 10000 UNIT/ML IJ SOLN: 20000.0000 [IU] | INTRAMUSCULAR | Status: DC

## 2012-07-02 MED ORDER — EPOETIN ALFA 20000 UNIT/ML IJ SOLN
INTRAMUSCULAR | Status: AC
  Administered 2012-07-02: 20000 [IU] via SUBCUTANEOUS
  Filled 2012-07-02: qty 1

## 2012-07-03 LAB — IRON AND TIBC: Iron: 48 ug/dL (ref 42–135)

## 2012-07-03 LAB — FERRITIN: Ferritin: 696 ng/mL — ABNORMAL HIGH (ref 10–291)

## 2012-07-15 ENCOUNTER — Other Ambulatory Visit (HOSPITAL_COMMUNITY): Payer: Self-pay | Admitting: *Deleted

## 2012-07-16 ENCOUNTER — Encounter (HOSPITAL_COMMUNITY)
Admission: RE | Admit: 2012-07-16 | Discharge: 2012-07-16 | Disposition: A | Discharge status: Home / Self Care | Payer: 59 | Source: Ambulatory Visit | Attending: Nephrology | Admitting: Nephrology

## 2012-07-16 MED ORDER — EPOETIN ALFA 10000 UNIT/ML IJ SOLN: 20000.0000 [IU] | INTRAMUSCULAR | Status: DC

## 2012-07-16 MED ORDER — EPOETIN ALFA 20000 UNIT/ML IJ SOLN
INTRAMUSCULAR | Status: AC
  Administered 2012-07-16: 20000 [IU] via SUBCUTANEOUS
  Filled 2012-07-16: qty 1

## 2012-08-06 ENCOUNTER — Encounter (HOSPITAL_COMMUNITY)
Admission: RE | Admit: 2012-08-06 | Discharge: 2012-08-06 | Disposition: A | Discharge status: Home / Self Care | Payer: 59 | Source: Ambulatory Visit | Attending: Nephrology | Admitting: Nephrology

## 2012-08-06 DIAGNOSIS — D638 Anemia in other chronic diseases classified elsewhere: Secondary | ICD-10-CM | POA: Insufficient documentation

## 2012-08-06 DIAGNOSIS — N185 Chronic kidney disease, stage 5: Secondary | ICD-10-CM | POA: Insufficient documentation

## 2012-08-06 LAB — POCT HEMOGLOBIN-HEMACUE: Hemoglobin: 10.9 g/dL — ABNORMAL LOW (ref 12.0–15.0)

## 2012-08-06 MED ORDER — EPOETIN ALFA 10000 UNIT/ML IJ SOLN: 20000.0000 [IU] | INTRAMUSCULAR | Status: DC

## 2012-08-06 MED ORDER — EPOETIN ALFA 20000 UNIT/ML IJ SOLN
INTRAMUSCULAR | Status: AC
  Administered 2012-08-06: 20000 [IU]
  Filled 2012-08-06: qty 1

## 2012-08-07 LAB — IRON AND TIBC
Iron: 48 ug/dL (ref 42–135)
Saturation Ratios: 21 % (ref 20–55)
TIBC: 227 ug/dL — ABNORMAL LOW (ref 250–470)

## 2012-08-27 ENCOUNTER — Encounter (HOSPITAL_COMMUNITY)
Admission: RE | Admit: 2012-08-27 | Discharge: 2012-08-27 | Disposition: A | Discharge status: Home / Self Care | Payer: 59 | Source: Ambulatory Visit | Attending: Nephrology | Admitting: Nephrology

## 2012-08-27 DIAGNOSIS — D638 Anemia in other chronic diseases classified elsewhere: Secondary | ICD-10-CM | POA: Insufficient documentation

## 2012-08-27 DIAGNOSIS — N185 Chronic kidney disease, stage 5: Secondary | ICD-10-CM | POA: Insufficient documentation

## 2012-08-27 MED ORDER — EPOETIN ALFA 20000 UNIT/ML IJ SOLN
INTRAMUSCULAR | Status: AC
  Administered 2012-08-27: 20000 [IU] via SUBCUTANEOUS
  Filled 2012-08-27: qty 1

## 2012-08-27 MED ORDER — EPOETIN ALFA 10000 UNIT/ML IJ SOLN: 20000.0000 [IU] | INTRAMUSCULAR | Status: DC

## 2012-09-17 ENCOUNTER — Encounter (HOSPITAL_COMMUNITY)
Admission: RE | Admit: 2012-09-17 | Discharge: 2012-09-17 | Disposition: A | Discharge status: Home / Self Care | Payer: 59 | Source: Ambulatory Visit | Attending: Nephrology | Admitting: Nephrology

## 2012-09-17 MED ORDER — EPOETIN ALFA 10000 UNIT/ML IJ SOLN: 20000.0000 [IU] | INTRAMUSCULAR | Status: DC

## 2012-09-17 MED ORDER — EPOETIN ALFA 20000 UNIT/ML IJ SOLN
INTRAMUSCULAR | Status: AC
  Administered 2012-09-17: 20000 [IU] via SUBCUTANEOUS
  Filled 2012-09-17: qty 1

## 2012-09-18 LAB — IRON AND TIBC
Iron: 60 ug/dL (ref 42–135)
Saturation Ratios: 25 % (ref 20–55)
TIBC: 238 ug/dL — ABNORMAL LOW (ref 250–470)

## 2012-10-08 ENCOUNTER — Encounter (HOSPITAL_COMMUNITY): Payer: 59

## 2012-10-13 ENCOUNTER — Other Ambulatory Visit (HOSPITAL_COMMUNITY): Payer: Self-pay | Admitting: *Deleted

## 2012-10-14 ENCOUNTER — Encounter (HOSPITAL_COMMUNITY)
Admission: RE | Admit: 2012-10-14 | Discharge: 2012-10-14 | Disposition: A | Discharge status: Home / Self Care | Payer: 59 | Source: Ambulatory Visit | Attending: Nephrology | Admitting: Nephrology

## 2012-10-14 DIAGNOSIS — N185 Chronic kidney disease, stage 5: Secondary | ICD-10-CM | POA: Insufficient documentation

## 2012-10-14 DIAGNOSIS — D638 Anemia in other chronic diseases classified elsewhere: Secondary | ICD-10-CM | POA: Insufficient documentation

## 2012-10-14 LAB — POCT HEMOGLOBIN-HEMACUE: Hemoglobin: 12.6 g/dL (ref 12.0–15.0)

## 2012-10-14 MED ORDER — EPOETIN ALFA 10000 UNIT/ML IJ SOLN: 20000.0000 [IU] | INTRAMUSCULAR | Status: DC

## 2012-10-14 NOTE — Progress Notes (Signed)
Unable to draw fe tibc today pt has requested that we redraw them at next appt

## 2012-10-29 ENCOUNTER — Encounter (HOSPITAL_COMMUNITY): Payer: 59

## 2012-11-02 ENCOUNTER — Other Ambulatory Visit: Payer: Self-pay | Admitting: Family Medicine

## 2012-11-02 DIAGNOSIS — M545 Low back pain: Secondary | ICD-10-CM

## 2012-11-02 DIAGNOSIS — M5410 Radiculopathy, site unspecified: Secondary | ICD-10-CM

## 2012-11-03 ENCOUNTER — Ambulatory Visit
Admission: RE | Admit: 2012-11-03 | Discharge: 2012-11-03 | Disposition: A | Discharge status: Home / Self Care | Payer: 59 | Source: Ambulatory Visit | Attending: Family Medicine | Admitting: Family Medicine

## 2012-11-03 DIAGNOSIS — M5410 Radiculopathy, site unspecified: Secondary | ICD-10-CM

## 2012-11-03 DIAGNOSIS — M545 Low back pain: Secondary | ICD-10-CM

## 2012-11-12 ENCOUNTER — Encounter (HOSPITAL_COMMUNITY)
Admission: RE | Admit: 2012-11-12 | Discharge: 2012-11-12 | Disposition: A | Discharge status: Home / Self Care | Payer: 59 | Source: Ambulatory Visit | Attending: Nephrology | Admitting: Nephrology

## 2012-11-12 DIAGNOSIS — N185 Chronic kidney disease, stage 5: Secondary | ICD-10-CM | POA: Insufficient documentation

## 2012-11-12 DIAGNOSIS — D638 Anemia in other chronic diseases classified elsewhere: Secondary | ICD-10-CM | POA: Insufficient documentation

## 2012-11-12 LAB — IRON AND TIBC
Iron: 68 ug/dL (ref 42–135)
TIBC: 216 ug/dL — ABNORMAL LOW (ref 250–470)
UIBC: 148 ug/dL (ref 125–400)

## 2012-11-12 LAB — POCT HEMOGLOBIN-HEMACUE: Hemoglobin: 10.8 g/dL — ABNORMAL LOW (ref 12.0–15.0)

## 2012-11-12 MED ORDER — EPOETIN ALFA 10000 UNIT/ML IJ SOLN: 20000.0000 [IU] | INTRAMUSCULAR | Status: DC

## 2012-11-12 MED ORDER — EPOETIN ALFA 20000 UNIT/ML IJ SOLN
INTRAMUSCULAR | Status: AC
  Administered 2012-11-12: 20000 [IU] via SUBCUTANEOUS
  Filled 2012-11-12: qty 1

## 2012-12-03 ENCOUNTER — Telehealth (INDEPENDENT_AMBULATORY_CARE_PROVIDER_SITE_OTHER): Payer: Self-pay | Admitting: Surgery

## 2012-12-03 NOTE — Telephone Encounter (Signed)
12/03/12 left message and mailed recall letter for pt to call [phone] to schedule a bariatric surgery follow-up appt with Dr. Daphine Deutscher. Has not been seen since her RNY 05/23/08. (lss)

## 2012-12-10 ENCOUNTER — Encounter (HOSPITAL_COMMUNITY)
Admission: RE | Admit: 2012-12-10 | Discharge: 2012-12-10 | Disposition: A | Discharge status: Home / Self Care | Payer: 59 | Source: Ambulatory Visit | Attending: Nephrology | Admitting: Nephrology

## 2012-12-10 ENCOUNTER — Other Ambulatory Visit (HOSPITAL_COMMUNITY): Payer: Self-pay | Admitting: *Deleted

## 2012-12-10 DIAGNOSIS — D638 Anemia in other chronic diseases classified elsewhere: Secondary | ICD-10-CM | POA: Insufficient documentation

## 2012-12-10 DIAGNOSIS — N185 Chronic kidney disease, stage 5: Secondary | ICD-10-CM | POA: Insufficient documentation

## 2012-12-10 MED ORDER — EPOETIN ALFA 20000 UNIT/ML IJ SOLN
INTRAMUSCULAR | Status: AC
  Administered 2012-12-10: 20000 [IU] via SUBCUTANEOUS
  Filled 2012-12-10: qty 1

## 2012-12-10 MED ORDER — EPOETIN ALFA 10000 UNIT/ML IJ SOLN
20000.0000 [IU] | INTRAMUSCULAR | Status: DC
Start: 2012-12-10 — End: 2012-12-11

## 2013-01-06 ENCOUNTER — Other Ambulatory Visit (HOSPITAL_COMMUNITY): Payer: Self-pay | Admitting: *Deleted

## 2013-01-07 ENCOUNTER — Encounter (INDEPENDENT_AMBULATORY_CARE_PROVIDER_SITE_OTHER): Payer: Self-pay | Admitting: Surgery

## 2013-01-07 ENCOUNTER — Encounter (HOSPITAL_COMMUNITY)
Admission: RE | Admit: 2013-01-07 | Discharge: 2013-01-07 | Disposition: A | Discharge status: Home / Self Care | Payer: 59 | Source: Ambulatory Visit | Attending: Nephrology | Admitting: Nephrology

## 2013-01-07 DIAGNOSIS — Z9884 Bariatric surgery status: Secondary | ICD-10-CM | POA: Insufficient documentation

## 2013-01-07 DIAGNOSIS — N185 Chronic kidney disease, stage 5: Secondary | ICD-10-CM | POA: Insufficient documentation

## 2013-01-07 DIAGNOSIS — D638 Anemia in other chronic diseases classified elsewhere: Secondary | ICD-10-CM | POA: Insufficient documentation

## 2013-01-07 LAB — POCT HEMOGLOBIN-HEMACUE: Hemoglobin: 11.2 g/dL — ABNORMAL LOW (ref 12.0–15.0)

## 2013-01-07 LAB — IRON AND TIBC
Saturation Ratios: 19 % — ABNORMAL LOW (ref 20–55)
TIBC: 207 ug/dL — ABNORMAL LOW (ref 250–470)
UIBC: 168 ug/dL (ref 125–400)

## 2013-01-07 MED ORDER — EPOETIN ALFA 10000 UNIT/ML IJ SOLN: 20000.0000 [IU] | INTRAMUSCULAR | Status: DC

## 2013-01-07 MED ORDER — FERUMOXYTOL INJECTION 510 MG/17 ML
INTRAVENOUS | Status: AC
  Filled 2013-01-07: qty 17

## 2013-01-07 MED ORDER — SODIUM CHLORIDE 0.9 % IV SOLN: Freq: Once | INTRAVENOUS | Status: DC

## 2013-01-07 MED ORDER — FERUMOXYTOL INJECTION 510 MG/17 ML
510.0000 mg | Freq: Once | INTRAVENOUS | Status: AC
  Administered 2013-01-07: 510 mg via INTRAVENOUS

## 2013-01-07 MED ORDER — EPOETIN ALFA 20000 UNIT/ML IJ SOLN
INTRAMUSCULAR | Status: AC
  Administered 2013-01-07: 20000 [IU] via SUBCUTANEOUS
  Filled 2013-01-07: qty 1

## 2013-02-03 ENCOUNTER — Other Ambulatory Visit (HOSPITAL_COMMUNITY): Payer: Self-pay | Admitting: *Deleted

## 2013-02-04 ENCOUNTER — Encounter (HOSPITAL_COMMUNITY)
Admission: RE | Admit: 2013-02-04 | Discharge: 2013-02-04 | Disposition: A | Discharge status: Home / Self Care | Payer: 59 | Source: Ambulatory Visit | Attending: Nephrology | Admitting: Nephrology

## 2013-02-04 DIAGNOSIS — N185 Chronic kidney disease, stage 5: Secondary | ICD-10-CM | POA: Insufficient documentation

## 2013-02-04 DIAGNOSIS — D638 Anemia in other chronic diseases classified elsewhere: Secondary | ICD-10-CM | POA: Insufficient documentation

## 2013-02-04 LAB — IRON AND TIBC: UIBC: 173 ug/dL (ref 125–400)

## 2013-02-04 MED ORDER — EPOETIN ALFA 10000 UNIT/ML IJ SOLN: 20000.0000 [IU] | INTRAMUSCULAR | Status: DC

## 2013-02-04 MED ORDER — EPOETIN ALFA 20000 UNIT/ML IJ SOLN
INTRAMUSCULAR | Status: AC
  Administered 2013-02-04: 20000 [IU] via SUBCUTANEOUS
  Filled 2013-02-04: qty 1

## 2013-03-04 ENCOUNTER — Encounter (HOSPITAL_COMMUNITY)
Admission: RE | Admit: 2013-03-04 | Discharge: 2013-03-04 | Disposition: A | Discharge status: Home / Self Care | Payer: 59 | Source: Ambulatory Visit | Attending: Nephrology | Admitting: Nephrology

## 2013-03-04 DIAGNOSIS — N185 Chronic kidney disease, stage 5: Secondary | ICD-10-CM | POA: Insufficient documentation

## 2013-03-04 DIAGNOSIS — D638 Anemia in other chronic diseases classified elsewhere: Secondary | ICD-10-CM | POA: Insufficient documentation

## 2013-03-04 LAB — IRON AND TIBC
Saturation Ratios: 23 % (ref 20–55)
TIBC: 212 ug/dL — ABNORMAL LOW (ref 250–470)

## 2013-03-04 LAB — POCT HEMOGLOBIN-HEMACUE: Hemoglobin: 11.2 g/dL — ABNORMAL LOW (ref 12.0–15.0)

## 2013-03-04 MED ORDER — EPOETIN ALFA 20000 UNIT/ML IJ SOLN
INTRAMUSCULAR | Status: AC
  Administered 2013-03-04: 20000 [IU] via SUBCUTANEOUS
  Filled 2013-03-04: qty 1

## 2013-03-04 MED ORDER — EPOETIN ALFA 10000 UNIT/ML IJ SOLN: 20000.0000 [IU] | INTRAMUSCULAR | Status: DC

## 2013-03-19 ENCOUNTER — Other Ambulatory Visit (HOSPITAL_COMMUNITY): Payer: Self-pay | Admitting: Family Medicine

## 2013-03-19 ENCOUNTER — Ambulatory Visit (HOSPITAL_COMMUNITY)
Admission: RE | Admit: 2013-03-19 | Discharge: 2013-03-19 | Disposition: A | Discharge status: Home / Self Care | Payer: 59 | Source: Ambulatory Visit | Attending: Family Medicine | Admitting: Family Medicine

## 2013-03-19 DIAGNOSIS — Z86718 Personal history of other venous thrombosis and embolism: Secondary | ICD-10-CM | POA: Insufficient documentation

## 2013-03-19 DIAGNOSIS — M25562 Pain in left knee: Secondary | ICD-10-CM

## 2013-03-19 DIAGNOSIS — M79609 Pain in unspecified limb: Secondary | ICD-10-CM

## 2013-03-19 DIAGNOSIS — M7989 Other specified soft tissue disorders: Secondary | ICD-10-CM

## 2013-03-19 NOTE — Progress Notes (Signed)
VASCULAR LAB PRELIMINARY  PRELIMINARY  PRELIMINARY  PRELIMINARY  Left lower extremity venous duplex completed.    Preliminary report:  Left:  No evidence of acute DVT, superficial thrombosis, or Baker's cyst.  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  Shalandria Elsbernd, RVT 03/19/2013, 5:09 PM

## 2013-03-31 ENCOUNTER — Other Ambulatory Visit (HOSPITAL_COMMUNITY): Payer: Self-pay

## 2013-04-01 ENCOUNTER — Encounter (HOSPITAL_COMMUNITY)
Admission: RE | Admit: 2013-04-01 | Discharge: 2013-04-01 | Disposition: A | Discharge status: Home / Self Care | Payer: 59 | Source: Ambulatory Visit | Attending: Nephrology | Admitting: Nephrology

## 2013-04-01 DIAGNOSIS — N185 Chronic kidney disease, stage 5: Secondary | ICD-10-CM | POA: Insufficient documentation

## 2013-04-01 DIAGNOSIS — D638 Anemia in other chronic diseases classified elsewhere: Secondary | ICD-10-CM | POA: Insufficient documentation

## 2013-04-01 LAB — IRON AND TIBC
Iron: 43 ug/dL (ref 42–135)
TIBC: 252 ug/dL (ref 250–470)

## 2013-04-01 MED ORDER — EPOETIN ALFA 10000 UNIT/ML IJ SOLN
20000.0000 [IU] | INTRAMUSCULAR | Status: DC
  Administered 2013-04-01: 20000 [IU] via SUBCUTANEOUS

## 2013-04-01 MED ORDER — EPOETIN ALFA 20000 UNIT/ML IJ SOLN
INTRAMUSCULAR | Status: AC
  Filled 2013-04-01: qty 1

## 2013-04-02 MED FILL — Epoetin Alfa Inj 20000 Unit/ML: INTRAMUSCULAR | Qty: 1 | Status: AC

## 2013-04-28 ENCOUNTER — Other Ambulatory Visit (HOSPITAL_COMMUNITY): Payer: Self-pay

## 2013-04-29 ENCOUNTER — Encounter (HOSPITAL_COMMUNITY)
Admission: RE | Admit: 2013-04-29 | Discharge: 2013-04-29 | Disposition: A | Discharge status: Home / Self Care | Payer: 59 | Source: Ambulatory Visit | Attending: Nephrology | Admitting: Nephrology

## 2013-04-29 DIAGNOSIS — D638 Anemia in other chronic diseases classified elsewhere: Secondary | ICD-10-CM | POA: Insufficient documentation

## 2013-04-29 DIAGNOSIS — N185 Chronic kidney disease, stage 5: Secondary | ICD-10-CM | POA: Insufficient documentation

## 2013-04-29 LAB — POCT HEMOGLOBIN-HEMACUE: Hemoglobin: 12.3 g/dL (ref 12.0–15.0)

## 2013-04-29 MED ORDER — FERUMOXYTOL INJECTION 510 MG/17 ML: 510.0000 mg | Freq: Once | INTRAVENOUS | Status: DC

## 2013-04-29 MED ORDER — EPOETIN ALFA 10000 UNIT/ML IJ SOLN: 20000.0000 [IU] | INTRAMUSCULAR | Status: DC

## 2013-04-29 MED ORDER — FERUMOXYTOL INJECTION 510 MG/17 ML
INTRAVENOUS | Status: AC
  Administered 2013-04-29: 14:00:00
  Filled 2013-04-29: qty 17

## 2013-04-29 MED ORDER — SODIUM CHLORIDE 0.9 % IV SOLN
INTRAVENOUS | Status: DC
  Administered 2013-04-29: 14:00:00 via INTRAVENOUS

## 2013-04-30 LAB — IRON AND TIBC
Saturation Ratios: 19 % — ABNORMAL LOW (ref 20–55)
UIBC: 176 ug/dL (ref 125–400)

## 2013-05-13 ENCOUNTER — Encounter (HOSPITAL_COMMUNITY): Payer: 59

## 2013-06-01 ENCOUNTER — Encounter (INDEPENDENT_AMBULATORY_CARE_PROVIDER_SITE_OTHER): Payer: Self-pay

## 2014-09-14 ENCOUNTER — Other Ambulatory Visit: Payer: Self-pay | Admitting: Nurse Practitioner

## 2014-10-26 ENCOUNTER — Encounter (HOSPITAL_COMMUNITY): Payer: Self-pay | Admitting: Vascular Surgery

## 2014-11-02 ENCOUNTER — Other Ambulatory Visit: Payer: Self-pay | Admitting: *Deleted

## 2014-11-02 NOTE — Telephone Encounter (Signed)
Dr. Jake SeatsSikora okayed a refill request for Jublia 10% plus 11 additional refills, apply to affected nails twice a day for 12 months.

## 2015-06-14 ENCOUNTER — Emergency Department (HOSPITAL_BASED_OUTPATIENT_CLINIC_OR_DEPARTMENT_OTHER)
Admit: 2015-06-14 | Discharge: 2015-06-14 | Disposition: A | Discharge status: Home / Self Care | Payer: 59 | Attending: Emergency Medicine | Admitting: Emergency Medicine

## 2015-06-14 ENCOUNTER — Encounter (HOSPITAL_COMMUNITY): Payer: Self-pay

## 2015-06-14 ENCOUNTER — Emergency Department (HOSPITAL_COMMUNITY)
Admission: EM | Admit: 2015-06-14 | Discharge: 2015-06-14 | Disposition: A | Discharge status: Home / Self Care | Payer: 59 | Attending: Emergency Medicine | Admitting: Emergency Medicine

## 2015-06-14 DIAGNOSIS — Z7982 Long term (current) use of aspirin: Secondary | ICD-10-CM | POA: Diagnosis not present

## 2015-06-14 DIAGNOSIS — N186 End stage renal disease: Secondary | ICD-10-CM | POA: Insufficient documentation

## 2015-06-14 DIAGNOSIS — M7981 Nontraumatic hematoma of soft tissue: Secondary | ICD-10-CM | POA: Diagnosis not present

## 2015-06-14 DIAGNOSIS — Z8669 Personal history of other diseases of the nervous system and sense organs: Secondary | ICD-10-CM | POA: Insufficient documentation

## 2015-06-14 DIAGNOSIS — Z79899 Other long term (current) drug therapy: Secondary | ICD-10-CM | POA: Insufficient documentation

## 2015-06-14 DIAGNOSIS — Z7952 Long term (current) use of systemic steroids: Secondary | ICD-10-CM | POA: Diagnosis not present

## 2015-06-14 DIAGNOSIS — D649 Anemia, unspecified: Secondary | ICD-10-CM | POA: Insufficient documentation

## 2015-06-14 DIAGNOSIS — M109 Gout, unspecified: Secondary | ICD-10-CM | POA: Diagnosis not present

## 2015-06-14 DIAGNOSIS — M7989 Other specified soft tissue disorders: Secondary | ICD-10-CM | POA: Insufficient documentation

## 2015-06-14 DIAGNOSIS — Z86718 Personal history of other venous thrombosis and embolism: Secondary | ICD-10-CM | POA: Insufficient documentation

## 2015-06-14 DIAGNOSIS — S40022A Contusion of left upper arm, initial encounter: Secondary | ICD-10-CM

## 2015-06-14 DIAGNOSIS — Z8719 Personal history of other diseases of the digestive system: Secondary | ICD-10-CM | POA: Insufficient documentation

## 2015-06-14 DIAGNOSIS — Z87891 Personal history of nicotine dependence: Secondary | ICD-10-CM | POA: Insufficient documentation

## 2015-06-14 DIAGNOSIS — Z9884 Bariatric surgery status: Secondary | ICD-10-CM | POA: Diagnosis not present

## 2015-06-14 NOTE — ED Notes (Signed)
Patient states she noticed swelling above the dialysis graft site. Today  During dialysis the swelling occurred again, increased pain, and had difficulty raising her arm.

## 2015-06-14 NOTE — Progress Notes (Signed)
*  PRELIMINARY RESULTS* Vascular Ultrasound Left upper extremity Duplex Dialysis Access has been completed.  Preliminary findings:   AV graft appears patent. At the left lateral shoulder, there is an area of mixed echoes. This does not appear to be related to the graft. Unable to confirm if this is a hematoma.    Farrel Demark, RDMS, RVT  06/14/2015, 2:29 PM

## 2015-06-14 NOTE — Discharge Instructions (Signed)
Keep ice on the swelling.   See vascular surgery as scheduled tomorrow.   Return to ER if you have worse swelling, bleeding from the graft, severe arm pain.

## 2015-06-14 NOTE — ED Provider Notes (Signed)
CSN: [id number]     Arrival date & time 06/14/15  1152 History   First MD Initiated Contact with Patient 06/14/15 1247     Chief Complaint  Patient presents with  . arm swelling at graft site.      (Consider location/radiation/quality/duration/timing/severity/associated sxs/prior Treatment) The history is provided by the patient.  [name] is a 59 y.o. female hx of DVT not on coumadin, ESRD on HD (last HD today), who presented with left arm swelling. Patient has dialysis graft on left arm. Had 2 hours of dialysis today and then the area started swelling up. Denies chest pain or shortness of breath.    Past Medical History  Diagnosis Date  . Lupus lupus with nephritis  . Chronic kidney disease   . Morbid obesity   . DVT (deep venous thrombosis)   . Gout   . Anemia   . Tachycardia   . GI (gastrointestinal bleed)   . Hyperlipidemia   . Gastric bypass status for obesity   . Sleep apnea, obstructive     does not use CPAP   Past Surgical History  Procedure Laterality Date  . Gastric bypass    . Inferior vena cava angioplasty / stenting    . Abdominal hysterectomy    . Laparoscopic gastric bypass  2010  . Av fistula placement  11/06/2011    Procedure: INSERTION OF ARTERIOVENOUS (AV) GORE-TEX GRAFT ARM;  Surgeon: Nilda Simmer, MD;  Location: [facility] OR;  Service: Vascular;  Laterality: Left;  Susie Cassette N/A 10/31/2011    Procedure: VENOGRAM;  Surgeon: Fransisco Hertz, MD;  Location: [facility] CATH LAB;  Service: Cardiovascular;  Laterality: N/A;   Family History  Problem Relation Age of Onset  . Anesthesia problems Neg Hx   . Heart failure Mother   . Cancer Father    History  Substance Use Topics  . Smoking status: Former Smoker -- 0.25 packs/day for 20 years    Types: Cigarettes    Quit date: 05/15/2011  . Smokeless tobacco: Never Used  . Alcohol Use: No   OB History    No data available     Review of Systems  Musculoskeletal:       L arm pain   All other systems  reviewed and are negative.     Allergies  Review of patient's allergies indicates no known allergies.  Home Medications   Prior to Admission medications   Medication Sig Start Date End Date Taking? Authorizing Provider  allopurinol (ZYLOPRIM) 300 MG tablet Take 150 mg by mouth daily.  09/28/11  Yes Historical Provider, MD  aspirin 81 MG tablet Take 81 mg by mouth daily.   Yes Historical Provider, MD  calcium acetate (PHOSLO) 667 MG capsule Take 667 mg by mouth 2 (two) times daily with a meal.  09/28/11  Yes Historical Provider, MD  folic acid-vitamin b complex-vitamin c-selenium-zinc (DIALYVITE) 3 MG TABS tablet Take 1 tablet by mouth daily.   Yes Historical Provider, MD  HYDROcodone-acetaminophen (NORCO) 10-325 MG per tablet Take 1 tablet by mouth every 6 (six) hours as needed for moderate pain or severe pain.  06/02/15  Yes Historical Provider, MD  pantoprazole (PROTONIX) 40 MG tablet Take 40 mg by mouth daily.    Yes Historical Provider, MD  predniSONE (DELTASONE) 10 MG tablet Take 10 mg by mouth daily with breakfast.   Yes Historical Provider, MD  VELPHORO 500 MG chewable tablet Chew 500 mg by mouth 3 (three) times daily. 04/03/15  Yes  Historical Provider, MD  vitamin B-12 (CYANOCOBALAMIN) 250 MCG tablet Take 250 mcg by mouth daily.     Yes Historical Provider, MD   BP 105/56 mmHg  Pulse 109  Temp(Src) 98.6 F (37 C) (Oral)  Resp 16  SpO2 95% Physical Exam  Constitutional: She is oriented to person, place, and time.  Chronically ill, NAD   HENT:  Head: Normocephalic.  Eyes: Conjunctivae are normal. Pupils are equal, round, and reactive to light.  Neck: Normal range of motion.  Cardiovascular: Normal rate.   Pulmonary/Chest: Effort normal and breath sounds normal. No respiratory distress. She has no wheezes. She has no rales.  Abdominal: Soft. Bowel sounds are normal. She exhibits no distension. There is no tenderness.  Musculoskeletal:  L arm graft with good bruit. There is  small hematoma above the graft. 2+ pulses. No hand or arm edema. Neurovascular intact    Neurological: She is alert and oriented to person, place, and time.  Skin: Skin is warm.  Psychiatric: She has a normal mood and affect. Her behavior is normal. Judgment and thought content normal.  Nursing note and vitals reviewed.   ED Course  Procedures (including critical care time) Labs Review Labs Reviewed - No data to display  Imaging Review No results found.   EKG Interpretation None        Expand All Collapse All   *PRELIMINARY RESULTS* Vascular Ultrasound Left upper extremity Duplex Dialysis Access has been completed. Preliminary findings:   AV graft appears patent. At the left lateral shoulder, there is an area of mixed echoes. This does not appear to be related to the graft. Unable to confirm if this is a hematoma.    Farrel Demark, RDMS, RVT 06/14/2015, 2:29 PM       MDM   Final diagnoses:  Left arm swelling    [name] is a 59 y.o. female here with L arm hematoma above graft site. No bleeding during dialysis. No signs of DVT. US showed that graft is operating but there may be a hematoma above the graft. She has appointment with vascular tomorrow and is stable for discharge.    Richardean Canal, MD 06/14/15 [phone]

## 2015-06-27 ENCOUNTER — Other Ambulatory Visit: Payer: Self-pay | Admitting: Gastroenterology

## 2015-06-27 DIAGNOSIS — R1013 Epigastric pain: Secondary | ICD-10-CM

## 2015-06-29 ENCOUNTER — Ambulatory Visit
Admission: RE | Admit: 2015-06-29 | Discharge: 2015-06-29 | Disposition: A | Discharge status: Home / Self Care | Payer: 59 | Source: Ambulatory Visit | Attending: Gastroenterology | Admitting: Gastroenterology

## 2015-06-29 DIAGNOSIS — R1013 Epigastric pain: Secondary | ICD-10-CM

## 2015-06-29 MED ORDER — IOPAMIDOL (ISOVUE-300) INJECTION 61%
100.0000 mL | Freq: Once | INTRAVENOUS | Status: AC | PRN
  Administered 2015-06-29: 100 mL via INTRAVENOUS

## 2015-08-31 ENCOUNTER — Ambulatory Visit (HOSPITAL_COMMUNITY)
Admission: RE | Admit: 2015-08-31 | Discharge: 2015-08-31 | Disposition: A | Discharge status: Home / Self Care | Payer: 59 | Source: Ambulatory Visit | Attending: Nephrology | Admitting: Nephrology

## 2015-08-31 DIAGNOSIS — N186 End stage renal disease: Secondary | ICD-10-CM | POA: Diagnosis present

## 2015-08-31 LAB — PREPARE RBC (CROSSMATCH)

## 2015-08-31 LAB — ABO/RH: ABO/RH(D): A POS

## 2015-08-31 MED ORDER — SODIUM CHLORIDE 0.9 % IV SOLN: Freq: Once | INTRAVENOUS | Status: DC

## 2015-08-31 NOTE — Progress Notes (Signed)
Pt arrived at [facility] for Transfusion of 1 unit of PRBCs; Diagnosis: ESRD; Pt tolerated well; no complications noted

## 2015-09-01 LAB — TYPE AND SCREEN
ABO/RH(D): A POS
ANTIBODY SCREEN: NEGATIVE
UNIT DIVISION: 0

## 2015-09-07 ENCOUNTER — Observation Stay (HOSPITAL_COMMUNITY)
Admission: EM | Admit: 2015-09-07 | Discharge: 2015-09-09 | Disposition: A | Discharge status: Home / Self Care | Payer: 59 | Attending: Internal Medicine | Admitting: Internal Medicine

## 2015-09-07 ENCOUNTER — Emergency Department (HOSPITAL_COMMUNITY): Payer: 59

## 2015-09-07 ENCOUNTER — Encounter (HOSPITAL_COMMUNITY): Payer: Self-pay | Admitting: Emergency Medicine

## 2015-09-07 DIAGNOSIS — Z9981 Dependence on supplemental oxygen: Secondary | ICD-10-CM | POA: Diagnosis not present

## 2015-09-07 DIAGNOSIS — Z7952 Long term (current) use of systemic steroids: Secondary | ICD-10-CM | POA: Diagnosis not present

## 2015-09-07 DIAGNOSIS — E785 Hyperlipidemia, unspecified: Secondary | ICD-10-CM | POA: Insufficient documentation

## 2015-09-07 DIAGNOSIS — R2240 Localized swelling, mass and lump, unspecified lower limb: Secondary | ICD-10-CM | POA: Diagnosis not present

## 2015-09-07 DIAGNOSIS — Z86718 Personal history of other venous thrombosis and embolism: Secondary | ICD-10-CM | POA: Diagnosis not present

## 2015-09-07 DIAGNOSIS — R200 Anesthesia of skin: Secondary | ICD-10-CM | POA: Insufficient documentation

## 2015-09-07 DIAGNOSIS — R000 Tachycardia, unspecified: Secondary | ICD-10-CM | POA: Insufficient documentation

## 2015-09-07 DIAGNOSIS — K922 Gastrointestinal hemorrhage, unspecified: Secondary | ICD-10-CM | POA: Insufficient documentation

## 2015-09-07 DIAGNOSIS — D649 Anemia, unspecified: Secondary | ICD-10-CM | POA: Diagnosis not present

## 2015-09-07 DIAGNOSIS — Z79899 Other long term (current) drug therapy: Secondary | ICD-10-CM | POA: Insufficient documentation

## 2015-09-07 DIAGNOSIS — R5383 Other fatigue: Secondary | ICD-10-CM | POA: Insufficient documentation

## 2015-09-07 DIAGNOSIS — R7989 Other specified abnormal findings of blood chemistry: Secondary | ICD-10-CM | POA: Diagnosis present

## 2015-09-07 DIAGNOSIS — M109 Gout, unspecified: Secondary | ICD-10-CM | POA: Diagnosis not present

## 2015-09-07 DIAGNOSIS — N186 End stage renal disease: Secondary | ICD-10-CM | POA: Diagnosis not present

## 2015-09-07 DIAGNOSIS — N189 Chronic kidney disease, unspecified: Secondary | ICD-10-CM | POA: Diagnosis not present

## 2015-09-07 DIAGNOSIS — Z7982 Long term (current) use of aspirin: Secondary | ICD-10-CM | POA: Insufficient documentation

## 2015-09-07 DIAGNOSIS — M7989 Other specified soft tissue disorders: Secondary | ICD-10-CM | POA: Diagnosis not present

## 2015-09-07 DIAGNOSIS — G4733 Obstructive sleep apnea (adult) (pediatric): Secondary | ICD-10-CM | POA: Insufficient documentation

## 2015-09-07 DIAGNOSIS — M329 Systemic lupus erythematosus, unspecified: Secondary | ICD-10-CM | POA: Diagnosis not present

## 2015-09-07 DIAGNOSIS — Z87891 Personal history of nicotine dependence: Secondary | ICD-10-CM | POA: Diagnosis not present

## 2015-09-07 LAB — BASIC METABOLIC PANEL
Anion gap: 12 (ref 5–15)
BUN: 31 mg/dL — AB (ref 6–20)
CALCIUM: 8 mg/dL — AB (ref 8.9–10.3)
CO2: 26 mmol/L (ref 22–32)
Chloride: 101 mmol/L (ref 101–111)
Creatinine, Ser: 4.18 mg/dL — ABNORMAL HIGH (ref 0.44–1.00)
GFR calc Af Amer: 12 mL/min — ABNORMAL LOW (ref >60)
GFR, EST NON AFRICAN AMERICAN: 11 mL/min — AB (ref >60)
GLUCOSE: 137 mg/dL — AB (ref 65–99)
Potassium: 3.2 mmol/L — ABNORMAL LOW (ref 3.5–5.1)
SODIUM: 139 mmol/L (ref 135–145)

## 2015-09-07 LAB — CBC
HCT: 21 % — ABNORMAL LOW (ref 36.0–46.0)
Hemoglobin: 6.7 g/dL — CL (ref 12.0–15.0)
MCH: 29.9 pg (ref 26.0–34.0)
MCHC: 31.9 g/dL (ref 30.0–36.0)
MCV: 93.8 fL (ref 78.0–100.0)
PLATELETS: 254 10*3/uL (ref 150–400)
RBC: 2.24 MIL/uL — ABNORMAL LOW (ref 3.87–5.11)
RDW: 18.5 % — AB (ref 11.5–15.5)
WBC: 7.5 10*3/uL (ref 4.0–10.5)

## 2015-09-07 LAB — POC OCCULT BLOOD, ED: Fecal Occult Bld: NEGATIVE

## 2015-09-07 LAB — FERRITIN: FERRITIN: 2411 ng/mL — AB (ref 11–307)

## 2015-09-07 LAB — PREPARE RBC (CROSSMATCH)

## 2015-09-07 LAB — IRON AND TIBC
IRON: 22 ug/dL — AB (ref 28–170)
Saturation Ratios: 19 % (ref 10.4–31.8)
TIBC: 118 ug/dL — AB (ref 250–450)
UIBC: 96 ug/dL

## 2015-09-07 LAB — RETICULOCYTES
RBC.: 2.18 MIL/uL — ABNORMAL LOW (ref 3.87–5.11)
Retic Count, Absolute: 37.1 10*3/uL (ref 19.0–186.0)
Retic Ct Pct: 1.7 % (ref 0.4–3.1)

## 2015-09-07 LAB — LACTATE DEHYDROGENASE: LDH: 249 U/L — AB (ref 98–192)

## 2015-09-07 LAB — FOLATE: FOLATE: 35 ng/mL (ref >5.9)

## 2015-09-07 LAB — VITAMIN B12: Vitamin B-12: 2288 pg/mL — ABNORMAL HIGH (ref 180–914)

## 2015-09-07 MED ORDER — MYCOPHENOLATE MOFETIL 250 MG PO CAPS
500.0000 mg | ORAL_CAPSULE | Freq: Two times a day (BID) | ORAL | Status: DC
  Administered 2015-09-08 – 2015-09-09 (×3): 500 mg via ORAL
  Filled 2015-09-07 (×5): qty 2

## 2015-09-07 MED ORDER — DIALYVITE 3000 3 MG PO TABS: 1.0000 | ORAL_TABLET | Freq: Every day | ORAL | Status: DC

## 2015-09-07 MED ORDER — HEPARIN SODIUM (PORCINE) 5000 UNIT/ML IJ SOLN
5000.0000 [IU] | Freq: Three times a day (TID) | INTRAMUSCULAR | Status: DC
  Administered 2015-09-07 – 2015-09-09 (×4): 5000 [IU] via SUBCUTANEOUS
  Filled 2015-09-07 (×3): qty 1

## 2015-09-07 MED ORDER — ACETAMINOPHEN 325 MG PO TABS: 650.0000 mg | ORAL_TABLET | Freq: Four times a day (QID) | ORAL | Status: DC | PRN

## 2015-09-07 MED ORDER — ALLOPURINOL 300 MG PO TABS
150.0000 mg | ORAL_TABLET | Freq: Every day | ORAL | Status: DC
  Administered 2015-09-09: 150 mg via ORAL
  Filled 2015-09-07 (×3): qty 1

## 2015-09-07 MED ORDER — SODIUM CHLORIDE 0.9 % IV SOLN
10.0000 mL/h | Freq: Once | INTRAVENOUS | Status: AC
  Administered 2015-09-07: 10 mL/h via INTRAVENOUS

## 2015-09-07 MED ORDER — ONDANSETRON HCL 4 MG/2ML IJ SOLN: 4.0000 mg | Freq: Four times a day (QID) | INTRAMUSCULAR | Status: DC | PRN

## 2015-09-07 MED ORDER — MEGESTROL ACETATE 40 MG/ML PO SUSP
200.0000 mg | Freq: Every day | ORAL | Status: DC
  Administered 2015-09-09 (×2): 200 mg via ORAL
  Filled 2015-09-07 (×2): qty 5

## 2015-09-07 MED ORDER — VITAMIN B-12 100 MCG PO TABS
250.0000 ug | ORAL_TABLET | Freq: Every day | ORAL | Status: DC
  Administered 2015-09-09: 250 ug via ORAL
  Filled 2015-09-07 (×2): qty 3

## 2015-09-07 MED ORDER — VITAMIN B-12 250 MCG PO TABS: 250.0000 ug | ORAL_TABLET | Freq: Every day | ORAL | Status: DC

## 2015-09-07 MED ORDER — HYDROCODONE-ACETAMINOPHEN 10-325 MG PO TABS
1.0000 | ORAL_TABLET | Freq: Four times a day (QID) | ORAL | Status: DC | PRN
  Administered 2015-09-09: 1 via ORAL
  Filled 2015-09-07: qty 1

## 2015-09-07 MED ORDER — RENA-VITE PO TABS
1.0000 | ORAL_TABLET | Freq: Every day | ORAL | Status: DC
  Administered 2015-09-09: 1 via ORAL
  Filled 2015-09-07 (×2): qty 1

## 2015-09-07 MED ORDER — PANTOPRAZOLE SODIUM 40 MG PO TBEC
40.0000 mg | DELAYED_RELEASE_TABLET | Freq: Two times a day (BID) | ORAL | Status: DC
  Administered 2015-09-09 (×2): 40 mg via ORAL
  Filled 2015-09-07 (×3): qty 1

## 2015-09-07 MED ORDER — ASPIRIN EC 81 MG PO TBEC
81.0000 mg | DELAYED_RELEASE_TABLET | Freq: Every day | ORAL | Status: DC
  Administered 2015-09-09: 81 mg via ORAL
  Filled 2015-09-07 (×2): qty 1

## 2015-09-07 MED ORDER — PREDNISONE 20 MG PO TABS
20.0000 mg | ORAL_TABLET | Freq: Every day | ORAL | Status: DC
  Administered 2015-09-08 – 2015-09-09 (×2): 20 mg via ORAL
  Filled 2015-09-07 (×2): qty 1

## 2015-09-07 NOTE — ED Provider Notes (Signed)
Complains of generalized weakness for approximately a week. Patient in no distress. Alert Glasgow Coma Score 15. Patient will be transfused with packed red cells. Consider to have symptomatic anemia  Doug SouSam Angelea Penny, MD 09/07/15 2109

## 2015-09-07 NOTE — ED Notes (Signed)
Blood transfusion consent signed at at the bedside

## 2015-09-07 NOTE — Progress Notes (Signed)
Medications not given, patient states she took her meds at 17:00 before arriving at the hospital.

## 2015-09-07 NOTE — ED Notes (Signed)
Blood transfusion audit started and sent to 6E via tube station

## 2015-09-07 NOTE — ED Notes (Signed)
Tomasa BlaseSchultz, PA at bedside.

## 2015-09-07 NOTE — ED Notes (Signed)
Pt sts he dialysis center called to say she needed to be admitted into hospital. She had blood transfusion last week that didn't work (hmg ws 6 at that time). Pt also has been being evaluated for 30-40 lb weight loss over past few months. She also c.o L leg pain and swelling (hx blood clots). Denies cp/sob.

## 2015-09-07 NOTE — H&P (Addendum)
[facility] History and Physical  [name] QIH:[id number] DOB: [birth year] DOA: 09/07/2015  Referring physician: EDP PCP: [name], MD   Chief Complaint: Anemia   HPI: [name] is a 59 y.o. female with ESRD dialysis MWF.  Becoming more anemic with weight loss over past 2 months.  Transfused at dialysis Oct 13th.  Has black stools, but these have been black for a long time.  Multiple guiac exams are negative for GIB (including in ED today).  Had CT abd/pelvis in Aug which was negative.  Had worsening swelling in legs over past couple of days, worsening SOB.  At dialysis yesterday lab work showed HGB in the 6 range.  She has been sent in for transfusion.  Review of Systems: Systems reviewed.  As above, otherwise negative  Past Medical History  Diagnosis Date  . Lupus (HCC) lupus with nephritis  . Chronic kidney disease   . Morbid obesity (HCC)   . DVT (deep venous thrombosis) (HCC)   . Gout   . Anemia   . Tachycardia   . GI (gastrointestinal bleed)   . Hyperlipidemia   . Gastric bypass status for obesity   . Sleep apnea, obstructive     does not use CPAP   Past Surgical History  Procedure Laterality Date  . Gastric bypass    . Inferior vena cava angioplasty / stenting    . Abdominal hysterectomy    . Laparoscopic gastric bypass  2010  . Av fistula placement  11/06/2011    Procedure: INSERTION OF ARTERIOVENOUS (AV) GORE-TEX GRAFT ARM;  Surgeon: Nilda Simmer, MD;  Location: [facility] OR;  Service: Vascular;  Laterality: Left;  Susie Cassette N/A 10/31/2011    Procedure: VENOGRAM;  Surgeon: Fransisco Hertz, MD;  Location: [facility] CATH LAB;  Service: Cardiovascular;  Laterality: N/A;   Social History:  reports that she quit smoking about 4 years ago. Her smoking use included Cigarettes. She has a 5 pack-year smoking history. She has never used smokeless tobacco. She reports that she does not drink alcohol or use illicit drugs.  No Known Allergies  Family History   Problem Relation Age of Onset  . Anesthesia problems Neg Hx   . Heart failure Mother   . Cancer Father      Prior to Admission medications   Medication Sig Start Date End Date Taking? Authorizing Provider  allopurinol (ZYLOPRIM) 300 MG tablet Take 150 mg by mouth daily.  09/28/11  Yes Historical Provider, MD  aspirin 81 MG tablet Take 81 mg by mouth daily.   Yes Historical Provider, MD  folic acid-vitamin b complex-vitamin c-selenium-zinc (DIALYVITE) 3 MG TABS tablet Take 1 tablet by mouth daily.   Yes Historical Provider, MD  HYDROcodone-acetaminophen (NORCO) 10-325 MG per tablet Take 1 tablet by mouth every 6 (six) hours as needed for moderate pain or severe pain.  06/02/15  Yes Historical Provider, MD  megestrol (MEGACE) 40 MG/ML suspension Take 200 mg by mouth daily.   Yes Historical Provider, MD  mycophenolate (CELLCEPT) 500 MG tablet Take 500 mg by mouth 2 (two) times daily.   Yes Historical Provider, MD  pantoprazole (PROTONIX) 40 MG tablet Take 40 mg by mouth 2 (two) times daily.    Yes Historical Provider, MD  predniSONE (DELTASONE) 20 MG tablet Take 20 mg by mouth daily with breakfast.   Yes Historical Provider, MD  vitamin B-12 (CYANOCOBALAMIN) 250 MCG tablet Take 250 mcg by mouth daily.     Yes Historical Provider, MD  Physical Exam: Filed Vitals:   09/07/15 2015  BP: 114/64  Pulse: 92  Temp:   Resp: 20    BP 114/64 mmHg  Pulse 92  Temp(Src) 98.5 F (36.9 C) (Oral)  Resp 20  Ht 5\' 1"  (1.549 m)  Wt 74.753 kg (164 lb 12.8 oz)  BMI 31.15 kg/m2  SpO2 100%  General Appearance:    Alert, oriented, no distress, appears stated age  Head:    Normocephalic, atraumatic  Eyes:    PERRL, EOMI, sclera non-icteric        Nose:   Nares without drainage or epistaxis. Mucosa, turbinates normal  Throat:   Moist mucous membranes. Oropharynx without erythema or exudate.  Neck:   Supple. No carotid bruits.  No thyromegaly.  No lymphadenopathy.   Back:     No CVA tenderness, no  spinal tenderness  Lungs:     Clear to auscultation bilaterally, without wheezes, rhonchi or rales  Chest wall:    No tenderness to palpitation  Heart:    Regular rate and rhythm without murmurs, gallops, rubs  Abdomen:     Soft, non-tender, nondistended, normal bowel sounds, no organomegaly  Genitalia:    deferred  Rectal:    deferred  Extremities:   No clubbing, cyanosis or edema.  Pulses:   2+ and symmetric all extremities  Skin:   Skin color, texture, turgor normal, no rashes or lesions  Lymph nodes:   Cervical, supraclavicular, and axillary nodes normal  Neurologic:   CNII-XII intact. Normal strength, sensation and reflexes      throughout    Labs on Admission:  Basic Metabolic Panel:  Recent Labs Lab 09/07/15 1857  NA 139  K 3.2*  CL 101  CO2 26  GLUCOSE 137*  BUN 31*  CREATININE 4.18*  CALCIUM 8.0*   Liver Function Tests: No results for input(s): AST, ALT, ALKPHOS, BILITOT, PROT, ALBUMIN in the last 168 hours. No results for input(s): LIPASE, AMYLASE in the last 168 hours. No results for input(s): AMMONIA in the last 168 hours. CBC:  Recent Labs Lab 09/07/15 1857  WBC 7.5  HGB 6.7*  HCT 21.0*  MCV 93.8  PLT 254   Cardiac Enzymes: No results for input(s): CKTOTAL, CKMB, CKMBINDEX, TROPONINI in the last 168 hours.  BNP (last 3 results) No results for input(s): PROBNP in the last 8760 hours. CBG: No results for input(s): GLUCAP in the last 168 hours.  Radiological Exams on Admission: No results found.  EKG: Independently reviewed.  Assessment/Plan Active Problems:   Symptomatic anemia   1. Symptomatic anemia - 1. Anemia panel ordered 2. She thinks that she is still taking erythropoetin injection every other week with dialysis (had been on this in the past). 3. LDH ordered, CMP for AM ordered to check bilirubin. 4. Transfuse 2 units PRBC 2. ESRD - 1. Dialysis tomorrow, left consult with nephrology voicemail. 3. Leg swelling - 1. Venous duplex  ordered 2. Has h/o DVT in leg previously. 4. Lupus - continue home meds    Code Status: Full  Family Communication: No family in room Disposition Plan: Admit to obs   Time spent: 70 min  GARDNER, JARED M. [facility] Pager [phone]  If 7AM-7PM, please contact the day team taking care of the patient [email or web address] Password [email or web address] 09/07/2015, 8:59 PM

## 2015-09-07 NOTE — ED Provider Notes (Signed)
CSN: [id number]     Arrival date & time 09/07/15  1834 History   First MD Initiated Contact with Patient 09/07/15 1950     Chief Complaint  Patient presents with  . Abnormal Lab     (Consider location/radiation/quality/duration/timing/severity/associated sxs/prior Treatment) HPI Comments: This is a 59 year old female who is end-stage renal due to lupus.  States since April.  She's lost her appetite and has lost weight due to poor food consumption.  Also in the last 2 months.  She's been becoming more and more anemic.  She was transfused at dialysis on October 13.  She was dialyzed yesterday and was called from the dialysis center today that she needed to come in for another blood transfusion.  As any recent illnesses, fever, nausea, vomiting, diarrhea.  She states her stools are always black because she takes supplements that make her stool dark in color.  Denies any abdominal pain.  She's had several tests recently to help ascertain the exact cause of her decreased appetite and weight loss.  Last being a CT scan of abdomen pelvis August 14, which was normal.  She states for the past several days.  She's had increased swelling in her lower legs as well as some bruising.  She states they are painful due to the swelling.  She also reports exertional dyspnea, which is new for her.  This a very independent lady who lives by herself and drives herself to and from dialysis.  The history is provided by the patient.    Past Medical History  Diagnosis Date  . Lupus (HCC) lupus with nephritis  . Chronic kidney disease   . Morbid obesity (HCC)   . DVT (deep venous thrombosis) (HCC)   . Gout   . Anemia   . Tachycardia   . GI (gastrointestinal bleed)   . Hyperlipidemia   . Gastric bypass status for obesity   . Sleep apnea, obstructive     does not use CPAP   Past Surgical History  Procedure Laterality Date  . Gastric bypass    . Inferior vena cava angioplasty / stenting    . Abdominal  hysterectomy    . Laparoscopic gastric bypass  2010  . Av fistula placement  11/06/2011    Procedure: INSERTION OF ARTERIOVENOUS (AV) GORE-TEX GRAFT ARM;  Surgeon: Nilda Simmer, MD;  Location: [facility] OR;  Service: Vascular;  Laterality: Left;  Susie Cassette N/A 10/31/2011    Procedure: VENOGRAM;  Surgeon: Fransisco Hertz, MD;  Location: [facility] CATH LAB;  Service: Cardiovascular;  Laterality: N/A;   Family History  Problem Relation Age of Onset  . Anesthesia problems Neg Hx   . Heart failure Mother   . Cancer Father    Social History  Substance Use Topics  . Smoking status: Former Smoker -- 0.25 packs/day for 20 years    Types: Cigarettes    Quit date: 05/15/2011  . Smokeless tobacco: Never Used  . Alcohol Use: No   OB History    No data available     Review of Systems  Constitutional: Positive for fatigue. Negative for fever and chills.  Respiratory: Negative for cough and shortness of breath.   Cardiovascular: Positive for leg swelling. Negative for chest pain.  Gastrointestinal: Negative for nausea and vomiting.  Neurological: Positive for numbness. Negative for dizziness and weakness.  All other systems reviewed and are negative.     Allergies  Review of patient's allergies indicates no known allergies.  Home Medications   Prior  to Admission medications   Medication Sig Start Date End Date Taking? Authorizing Provider  allopurinol (ZYLOPRIM) 300 MG tablet Take 150 mg by mouth daily.  09/28/11  Yes Historical Provider, MD  aspirin 81 MG tablet Take 81 mg by mouth daily.   Yes Historical Provider, MD  folic acid-vitamin b complex-vitamin c-selenium-zinc (DIALYVITE) 3 MG TABS tablet Take 1 tablet by mouth daily.   Yes Historical Provider, MD  HYDROcodone-acetaminophen (NORCO) 10-325 MG per tablet Take 1 tablet by mouth every 6 (six) hours as needed for moderate pain or severe pain.  06/02/15  Yes Historical Provider, MD  megestrol (MEGACE) 40 MG/ML suspension Take 200 mg by  mouth daily.   Yes Historical Provider, MD  mycophenolate (CELLCEPT) 500 MG tablet Take 500 mg by mouth 2 (two) times daily.   Yes Historical Provider, MD  pantoprazole (PROTONIX) 40 MG tablet Take 40 mg by mouth 2 (two) times daily.    Yes Historical Provider, MD  predniSONE (DELTASONE) 20 MG tablet Take 20 mg by mouth daily with breakfast.   Yes Historical Provider, MD  vitamin B-12 (CYANOCOBALAMIN) 250 MCG tablet Take 250 mcg by mouth daily.     Yes Historical Provider, MD   BP 124/65 mmHg  Pulse 95  Temp(Src) 98.8 F (37.1 C) (Oral)  Resp 18  Ht 5\' 1"  (1.549 m)  Wt 164 lb 12.8 oz (74.753 kg)  BMI 31.15 kg/m2  SpO2 100% Physical Exam  Constitutional: She is oriented to person, place, and time. She appears well-developed and well-nourished.  HENT:  Head: Normocephalic.  Right Ear: External ear normal.  Left Ear: External ear normal.  Mouth/Throat: Oropharynx is clear and moist.  Eyes: Pupils are equal, round, and reactive to light.  Neck: Normal range of motion.  Cardiovascular: Regular rhythm.  Tachycardia present.   Pulmonary/Chest: Effort normal and breath sounds normal.  Abdominal: Soft.  Musculoskeletal: She exhibits edema and tenderness.  Neurological: She is alert and oriented to person, place, and time.  Skin: Skin is warm and dry.  Psychiatric: She has a normal mood and affect.  Nursing note and vitals reviewed.   ED Course  Procedures (including critical care time) Labs Review Labs Reviewed  CBC - Abnormal; Notable for the following:    RBC 2.24 (*)    Hemoglobin 6.7 (*)    HCT 21.0 (*)    RDW 18.5 (*)    All other components within normal limits  BASIC METABOLIC PANEL - Abnormal; Notable for the following:    Potassium 3.2 (*)    Glucose, Bld 137 (*)    BUN 31 (*)    Creatinine, Ser 4.18 (*)    Calcium 8.0 (*)    GFR calc non Af Amer 11 (*)    GFR calc Af Amer 12 (*)    All other components within normal limits  LACTATE DEHYDROGENASE - Abnormal;  Notable for the following:    LDH 249 (*)    All other components within normal limits  VITAMIN B12 - Abnormal; Notable for the following:    Vitamin B-12 2288 (*)    All other components within normal limits  IRON AND TIBC - Abnormal; Notable for the following:    Iron 22 (*)    TIBC 118 (*)    All other components within normal limits  FERRITIN - Abnormal; Notable for the following:    Ferritin 2411 (*)    All other components within normal limits  RETICULOCYTES - Abnormal; Notable for the following:  RBC. 2.18 (*)    All other components within normal limits  MRSA PCR SCREENING  FOLATE  COMPREHENSIVE METABOLIC PANEL  POC OCCULT BLOOD, ED  TYPE AND SCREEN  PREPARE RBC (CROSSMATCH)    Imaging Review Dg Chest 2 View  09/07/2015  CLINICAL DATA:  Acute onset of anemia. Weakness and cough. Initial encounter. EXAM: CHEST  2 VIEW COMPARISON:  Chest radiograph performed 09/20/2010 FINDINGS: The lungs are well-aerated. Mild left basilar scarring is noted. There is no evidence of focal opacification, pleural effusion or pneumothorax. The heart is borderline normal in size. No acute osseous abnormalities are seen. A vascular stent is noted overlying the proximal left arm. There is chronic superior subluxation of the right humeral head, likely reflecting remote rotator cuff tear. IMPRESSION: Mild left basilar scarring noted. No acute cardiopulmonary process seen. Electronically Signed   By: Roanna Raider M.D.   On: 09/07/2015 21:14   I have personally reviewed and evaluated these images and lab results as part of my medical decision-making.   EKG Interpretation None     Patient will be admitted for transfusion.  I have ordered 2 units and menorrhagia for the patient to be admitted MDM   Final diagnoses:  Symptomatic anemia         Earley Favor, NP 09/07/15 2049  Earley Favor, NP 09/08/15 0100  Doug Sou, MD 09/08/15 2319

## 2015-09-07 NOTE — Progress Notes (Signed)
New Admission Note:  Arrival Method: via stretcher with ED RN Mental Orientation: Alert & oriented x4 Telemetry: N/A Assessment: Completed Skin: ecchymosis on arms and legs IV: Right AC, blood transfusing upon arrival to floor Pain: denies any pain Tubes: n/a Safety Measures: Safety Fall Prevention Plan was given, discussed and signed. Admission: Completed [address] [facility] Orientation: Patient has been orientated to the room, unit and the staff. Family: none at bedside  Orders have been reviewed and implemented. Will continue to monitor the patient. Call light has been placed within reach and bed alarm has been activated.   Tempie DonningMercy Aydan Levitz BSN, RN  Phone Number: [phone] [facility] [place] [facility]

## 2015-09-08 ENCOUNTER — Observation Stay (HOSPITAL_BASED_OUTPATIENT_CLINIC_OR_DEPARTMENT_OTHER): Payer: 59

## 2015-09-08 DIAGNOSIS — M7989 Other specified soft tissue disorders: Secondary | ICD-10-CM

## 2015-09-08 DIAGNOSIS — D649 Anemia, unspecified: Secondary | ICD-10-CM | POA: Diagnosis not present

## 2015-09-08 DIAGNOSIS — M329 Systemic lupus erythematosus, unspecified: Secondary | ICD-10-CM | POA: Diagnosis not present

## 2015-09-08 DIAGNOSIS — N186 End stage renal disease: Secondary | ICD-10-CM | POA: Diagnosis not present

## 2015-09-08 DIAGNOSIS — Z992 Dependence on renal dialysis: Secondary | ICD-10-CM

## 2015-09-08 DIAGNOSIS — R600 Localized edema: Secondary | ICD-10-CM

## 2015-09-08 LAB — COMPREHENSIVE METABOLIC PANEL
ALBUMIN: 1.9 g/dL — AB (ref 3.5–5.0)
ALK PHOS: 149 U/L — AB (ref 38–126)
ALT: 28 U/L (ref 14–54)
AST: 13 U/L — ABNORMAL LOW (ref 15–41)
Anion gap: 11 (ref 5–15)
BUN: 40 mg/dL — ABNORMAL HIGH (ref 6–20)
CALCIUM: 7.1 mg/dL — AB (ref 8.9–10.3)
CO2: 28 mmol/L (ref 22–32)
CREATININE: 4.99 mg/dL — AB (ref 0.44–1.00)
Chloride: 100 mmol/L — ABNORMAL LOW (ref 101–111)
GFR calc Af Amer: 10 mL/min — ABNORMAL LOW (ref >60)
GFR calc non Af Amer: 9 mL/min — ABNORMAL LOW (ref >60)
GLUCOSE: 104 mg/dL — AB (ref 65–99)
Potassium: 3 mmol/L — ABNORMAL LOW (ref 3.5–5.1)
SODIUM: 139 mmol/L (ref 135–145)
Total Bilirubin: 1.3 mg/dL — ABNORMAL HIGH (ref 0.3–1.2)
Total Protein: 4.2 g/dL — ABNORMAL LOW (ref 6.5–8.1)

## 2015-09-08 LAB — MRSA PCR SCREENING: MRSA BY PCR: NEGATIVE

## 2015-09-08 MED ORDER — NEPRO/CARBSTEADY PO LIQD
237.0000 mL | Freq: Two times a day (BID) | ORAL | Status: DC
  Administered 2015-09-08 – 2015-09-09 (×2): 237 mL via ORAL

## 2015-09-08 NOTE — Progress Notes (Signed)
[facility]                                                                              Patient Demographics  [name], is a 59 y.o. female, DOB - [birth year], ZHY:[id number]  Admit date - 09/07/2015   Admitting Physician Hillary Bow, DO  Outpatient Primary MD for the patient is [name], MD  LOS -    Chief Complaint  Patient presents with  . Abnormal Lab      HPI on 09/07/2015 by Dr. Lyda Perone [name] is a 59 y.o. female with ESRD dialysis MWF. Becoming more anemic with weight loss over past 2 months. Transfused at dialysis Oct 13th. Has black stools, but these have been black for a long time. Multiple guiac exams are negative for GIB (including in ED today). Had CT abd/pelvis in Aug which was negative. Had worsening swelling in legs over past couple of days, worsening SOB. At dialysis yesterday lab work showed HGB in the 6 range. She has been sent in for transfusion.  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 allopurinol  150 mg Oral Daily  . aspirin EC  81 mg Oral Daily  . feeding supplement (NEPRO CARB STEADY)  237 mL Oral BID BM  . heparin  5,000 Units Subcutaneous 3 times per day  . megestrol  200 mg Oral Daily  . multivitamin  1 tablet Oral QHS  . mycophenolate  500 mg Oral BID  . pantoprazole  40 mg Oral BID  . predniSONE  20 mg Oral Q breakfast  . vitamin B-12  250 mcg Oral Daily   Continuous Infusions:  PRN Meds:.acetaminophen, HYDROcodone-acetaminophen, ondansetron (ZOFRAN) IV  Antibiotics    Anti-infectives    None      Subjective:   [name] seen and examined today.  He denies chest pain, shortness of breath, abdominal pain. She does state she has had some dizziness and lightheadedness in the past. Patient states that she has been experiencing a decreased appetite since June 2016. She has been taking her Megace. She denies any problems swallowing or nausea, change in bowel pattern.  Objective:  Filed Vitals:   09/07/15 2155 09/07/15 2247 09/08/15 0559 09/08/15 0756  BP: 108/63 124/65 117/56 115/60  Pulse: 97 95 77 85  Temp: 98.9 F (37.2 C) 98.8 F (37.1 C) 97.7 F (36.5 C) 98 F (36.7 C)  TempSrc: Oral Oral Oral Oral  Resp: 18 18 16 17   Height:      Weight:      SpO2: 100% 100% 100% 100%    Wt Readings from Last 3 Encounters:  09/07/15 74.753 kg (164 lb 12.8 oz)  04/29/13 97.977 kg (216 lb)  01/07/13 99.791 kg (220 lb)     Intake/Output Summary (Last 24 hours) at 09/08/15 1344 Last data filed at 09/08/15 1035  Gross per 24 hour  Intake    335 ml  Output    200 ml  Net    135 ml    Exam  General: Well developed, well nourished, NAD, appears stated age  HEENT: NCAT, mucous membranes moist.   Cardiovascular: S1 S2 auscultated, Regular rate and rhythm.  Respiratory: Clear to  auscultation bilaterally with equal chest rise  Abdomen: Soft, obese, nontender, nondistended, + bowel sounds  Extremities: warm dry without cyanosis clubbing. +LE edema  Neuro: AAOx3, nonfocal  Psych: Normal affect and demeanor   Data Review   Micro Results Recent Results (from the past 240 hour(s))  MRSA PCR Screening     Status: None   Collection Time: 09/07/15 10:47 PM  Result Value Ref Range Status   MRSA by PCR NEGATIVE NEGATIVE Final    Comment:        The GeneXpert MRSA Assay (FDA approved for NASAL specimens only), is one component of a comprehensive MRSA colonization surveillance program. It is not intended to diagnose MRSA infection nor to guide or monitor treatment for MRSA infections.     Radiology Reports Dg Chest 2 View  09/07/2015  CLINICAL DATA:  Acute onset of anemia. Weakness and cough. Initial encounter. EXAM: CHEST  2 VIEW COMPARISON:  Chest radiograph performed 09/20/2010 FINDINGS: The lungs are well-aerated. Mild left basilar scarring is noted. There is no evidence of focal opacification, pleural effusion or pneumothorax. The heart is borderline normal in size. No acute osseous abnormalities are seen. A vascular stent is noted overlying the proximal left arm. There is chronic superior subluxation of the right humeral head, likely reflecting remote rotator cuff tear. IMPRESSION: Mild left basilar scarring noted. No acute cardiopulmonary process seen. Electronically Signed   By: Roanna RaiderJeffery  Chang M.D.   On: 09/07/2015 21:14    CBC  Recent Labs Lab 09/07/15 1857  WBC 7.5  HGB 6.7*  HCT 21.0*  PLT 254  MCV 93.8  MCH 29.9  MCHC 31.9  RDW 18.5*    Chemistries   Recent Labs Lab 09/07/15 1857 09/08/15 0312  NA 139 139  K 3.2* 3.0*  CL 101 100*  CO2 26 28  GLUCOSE 137* 104*  BUN 31* 40*  CREATININE 4.18* 4.99*  CALCIUM 8.0* 7.1*  AST  --  13*  ALT  --  28  ALKPHOS  --  149*  BILITOT  --  1.3*    ------------------------------------------------------------------------------------------------------------------ estimated creatinine clearance is 11.2 mL/min (by C-G formula based on Cr of 4.99). ------------------------------------------------------------------------------------------------------------------ No results for input(s): HGBA1C in the last 72 hours. ------------------------------------------------------------------------------------------------------------------ No results for input(s): CHOL, HDL, LDLCALC, TRIG, CHOLHDL, LDLDIRECT in the last 72 hours. ------------------------------------------------------------------------------------------------------------------ No results for input(s): TSH, T4TOTAL, T3FREE, THYROIDAB in the last 72 hours.  Invalid input(s): FREET3 ------------------------------------------------------------------------------------------------------------------  Recent Labs  09/07/15 2120  VITAMINB12 2288*  FOLATE 35.0  FERRITIN 2411*  TIBC 118*  IRON 22*  RETICCTPCT 1.7    Coagulation profile No results for input(s): INR, PROTIME in the last 168 hours.  No results for input(s): DDIMER in the last 72 hours.  Cardiac Enzymes No results for input(s): CKMB, TROPONINI, MYOGLOBIN in the last 168 hours.  Invalid input(s): CK ------------------------------------------------------------------------------------------------------------------ Invalid input(s): POCBNP   Doaa Kendzierski D.O. on 09/08/2015 at 1:44 PM  Between 7am to 7pm - Pager - [phone]  After 7pm go to [email or web address] - password [email or web address]  And look for the night coverage person covering for me after hours  [facility] Office  [phone]

## 2015-09-08 NOTE — Progress Notes (Addendum)
*  PRELIMINARY RESULTS* Vascular Ultrasound Lower extremity venous duplex has been completed.  Preliminary findings: negative for acute DVT. Chronic DVT noted in the left femoral vein and left popliteal vein.   Farrel DemarkJill Eunice, RDMS, RVT  09/08/2015, 11:05 AM

## 2015-09-08 NOTE — Consult Note (Signed)
[facility] Renal Consultation Note    Indication for Consultation:  Management of ESRD/hemodialysis; anemia, hypertension/volume and secondary hyperparathyroidism PCP:  HPI: [name] is a 59 y.o. female with ESRD who has hemodialysis MWF at [facility]. Past medical history significant for ESRD, Lupus, GI bleed, obesity with gastric bypass, OSA, gout, anemia of chronic disease, secondary hyperparathyroidism, hyperlipidemia. Patient has had issues with low hemoglobin and has been seen by GI (Dr. Oletta Lamas) without elucidation of cause. Other issues have been weight loss and lupus flare. Has been started on megace for decreased appetite, prednisone for lupus. Patient is primarily followed by Dr. Jimmy Footman in hemodialysis clinic. In center guiac tests have been negative. Patient had hemoglobin of 6.8 09/06/2015 and was sent to hospital for evaluation by Dr. Jimmy Footman for transfusion and evaluation of anemia. Other issues include low ca+, chronic wt loss but has generalized edema. Have been lowering EDW weekly to adjust for wt loss and fluid status. Last week, patient was unable to walk unassisted into dialysis center due to fatigue and DOE.   Patient currently denies C/O chest pain, SOB, fevers, chills, nausea, vomiting, diarrhea, constipation. Denies tarry stools, hemoptysis,hematocrezia. Patient has generalized edema, weakness. No specific complaints. Patient has HD MWF at [facility]. Usually attends treatments and is compliant with medical regime. Last OP clinical labs as follows:  Ca 6.5 C Ca 7.3 Phos 5.2. Alk Phos 71 PTH 202. (09/01/2015) Calcium Acetate 667 mg 3 tabs PO TID with meals. Calcitriol 1.5 mcg PO Q MWF. HGB 6.8 Fe-60 TSAT 38% on Mircera 200 mcg q 2 weeks, last dose 09/01/2015.  Past Medical History  Diagnosis Date  . 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 Chronic kidney disease   . Morbid obesity ([place])   . DVT (deep venous thrombosis) ([place])   . Gout   . Anemia    . Tachycardia   . GI (gastrointestinal bleed)   . Hyperlipidemia   . Gastric bypass status for obesity   . Sleep apnea, obstructive     does not use CPAP   Past Surgical History  Procedure Laterality Date  . Gastric bypass    . Inferior vena cava angioplasty / stenting    . Abdominal hysterectomy    . Laparoscopic gastric bypass  2010  . Av fistula placement  11/06/2011    Procedure: INSERTION OF ARTERIOVENOUS (AV) GORE-TEX GRAFT ARM;  Surgeon: Hinda Lenis, MD;  Location: [facility];  Service: Vascular;  Laterality: Left;  Annell Greening N/A 10/31/2011    Procedure: VENOGRAM;  Surgeon: Conrad Maiden, MD;  Location: [facility] CATH LAB;  Service: Cardiovascular;  Laterality: N/A;   Family History  Problem Relation Age of Onset  . Anesthesia problems Neg Hx   . Heart failure Mother   . Cancer Father    Social History:  reports that she quit smoking about 4 years ago. Her smoking use included Cigarettes. She has a 5 pack-year smoking history. She has never used smokeless tobacco. She reports that she does not drink alcohol or use illicit drugs. No Known Allergies Prior to Admission medications   Medication Sig Start Date End Date Taking? Authorizing Provider  allopurinol (ZYLOPRIM) 300 MG tablet Take 150 mg by mouth daily.  09/28/11  Yes Historical Provider, MD  aspirin 81 MG tablet Take 81 mg by mouth daily.   Yes Historical Provider, MD  folic acid-vitamin b complex-vitamin c-selenium-zinc (DIALYVITE) 3 MG TABS tablet Take 1 tablet by mouth daily.   Yes Historical  Provider, MD  HYDROcodone-acetaminophen (NORCO) 10-325 MG per tablet Take 1 tablet by mouth every 6 (six) hours as needed for moderate pain or severe pain.  06/02/15  Yes Historical Provider, MD  megestrol (MEGACE) 40 MG/ML suspension Take 200 mg by mouth daily.   Yes Historical Provider, MD  mycophenolate (CELLCEPT) 500 MG tablet Take 500 mg by mouth 2 (two) times daily.   Yes Historical Provider, MD  pantoprazole (PROTONIX) 40 MG  tablet Take 40 mg by mouth 2 (two) times daily.    Yes Historical Provider, MD  predniSONE (DELTASONE) 20 MG tablet Take 20 mg by mouth daily with breakfast.   Yes Historical Provider, MD  vitamin B-12 (CYANOCOBALAMIN) 250 MCG tablet Take 250 mcg by mouth daily.     Yes Historical Provider, MD   Current Facility-Administered Medications  Medication Dose Route Frequency Provider Last Rate Last Dose  . acetaminophen (TYLENOL) tablet 650 mg  650 mg Oral Q6H PRN Etta Quill, DO      . allopurinol (ZYLOPRIM) tablet 150 mg  150 mg Oral Daily Etta Quill, DO   150 mg at 09/07/15 2243  . aspirin EC tablet 81 mg  81 mg Oral Daily Etta Quill, DO      . heparin injection 5,000 Units  5,000 Units Subcutaneous 3 times per day Etta Quill, DO   5,000 Units at 09/07/15 2253  . HYDROcodone-acetaminophen (NORCO) 10-325 MG per tablet 1 tablet  1 tablet Oral Q6H PRN Etta Quill, DO      . megestrol (MEGACE) 40 MG/ML suspension 200 mg  200 mg Oral Daily Etta Quill, DO      . multivitamin (RENA-VIT) tablet 1 tablet  1 tablet Oral QHS Etta Quill, DO   1 tablet at 09/07/15 2200  . mycophenolate (CELLCEPT) capsule 500 mg  500 mg Oral BID Etta Quill, DO   500 mg at 09/07/15 2200  . ondansetron (ZOFRAN) injection 4 mg  4 mg Intravenous Q6H PRN Etta Quill, DO      . pantoprazole (PROTONIX) EC tablet 40 mg  40 mg Oral BID Etta Quill, DO   40 mg at 09/07/15 2200  . predniSONE (DELTASONE) tablet 20 mg  20 mg Oral Q breakfast Etta Quill, DO   20 mg at 09/08/15 7673  . vitamin B-12 (CYANOCOBALAMIN) tablet 250 mcg  250 mcg Oral Daily Etta Quill, DO       Labs: Basic Metabolic Panel:  Recent Labs Lab 09/07/15 1857 09/08/15 0312  NA 139 139  K 3.2* 3.0*  CL 101 100*  CO2 26 28  GLUCOSE 137* 104*  BUN 31* 40*  CREATININE 4.18* 4.99*  CALCIUM 8.0* 7.1*   Liver Function Tests:  Recent Labs Lab 09/08/15 0312  AST 13*  ALT 28  ALKPHOS 149*  BILITOT 1.3*   PROT 4.2*  ALBUMIN 1.9*   No results for input(s): LIPASE, AMYLASE in the last 168 hours. No results for input(s): AMMONIA in the last 168 hours. CBC:  Recent Labs Lab 09/07/15 1857  WBC 7.5  HGB 6.7*  HCT 21.0*  MCV 93.8  PLT 254   Cardiac Enzymes: No results for input(s): CKTOTAL, CKMB, CKMBINDEX, TROPONINI in the last 168 hours. CBG: No results for input(s): GLUCAP in the last 168 hours. Iron Studies:  Recent Labs  09/07/15 2120  IRON 22*  TIBC 118*  FERRITIN 2411*   Studies/Results: Dg Chest 2 View  09/07/2015  CLINICAL DATA:  Acute onset of anemia. Weakness and cough. Initial encounter. EXAM: CHEST  2 VIEW COMPARISON:  Chest radiograph performed 09/20/2010 FINDINGS: The lungs are well-aerated. Mild left basilar scarring is noted. There is no evidence of focal opacification, pleural effusion or pneumothorax. The heart is borderline normal in size. No acute osseous abnormalities are seen. A vascular stent is noted overlying the proximal left arm. There is chronic superior subluxation of the right humeral head, likely reflecting remote rotator cuff tear. IMPRESSION: Mild left basilar scarring noted. No acute cardiopulmonary process seen. Electronically Signed   By: Garald Balding M.D.   On: 09/07/2015 21:14    ROS: As per HPI otherwise negative.  Physical Exam: Filed Vitals:   09/07/15 2155 09/07/15 2247 09/08/15 0559 09/08/15 0756  BP: 108/63 124/65 117/56 115/60  Pulse: 97 95 77 85  Temp: 98.9 F (37.2 C) 98.8 F (37.1 C) 97.7 F (36.5 C) 98 F (36.7 C)  TempSrc: Oral Oral Oral Oral  Resp: $Remo'18 18 16 17  'BkBih$ Height:      Weight:      SpO2: 100% 100% 100% 100%     General: Chronically ill appearing female in NAD Head: Normocephalic, atraumatic, sclera non-icteric, mucus membranes are moist. Ocular edema and facial edema noted. Neck: Supple. JVD not elevated. Lungs: Clear bilaterally to auscultation without wheezes, rales, or rhonchi. Breathing is  unlabored. Heart: RRR with S1 S2. No murmurs, rubs, or gallops appreciated. Abdomen: Soft, non-tender, non-distended with normoactive bowel sounds. No rebound/guarding. No obvious abdominal masses. Lower extremities: 2+ pitting edema bilateral LE.  Neuro: Alert and oriented X 3. Moves all extremities spontaneously. Psych:  Responds to questions appropriately with a flat affect. Dialysis Access: LUA AVF + thrill + Bruit  Dialysis Orders: Center:[facility]  on MWF . EDW 75 kg HD Bath 2.0  Time 4 hours Heparin 5000 units q tx. Access LUA AVF BFR 400 DFR 800    Mircera 200 mcg IV q 2 weeks (last dose 09/01/2015) Calcitriol 1.5 mcg PO MWF (last dose 09/06/2015)  Assessment/Plan: 1.  Symptomatic Anemia: Received 1 unit of PRBCs upon admission. Repeat HGB in HD today. Transfuse if < 7.0 2.  ESRD -  MWF at [facility]. HD today. K+3.0. Use 4.0K bath. 3.  Hypertension/volume  - No BP meds. Last post HD wt under EDW. Will attempt 3-4 liters as tolerated today and continue lowering EDW. No evidence of volume overload on CXR. 4.  Anemia  - As above. Follow CBC.  5.  Metabolic bone disease - Ca 7.1 C Ca 8.87. Continue calcitriol/binders.  6.  Nutrition - Albumin 1.9. Needs renal diet. Protein supplements/renal vit ordered. Dietary consult for protein malnutrition.  7. Lupus: Continue Prednisone/Cellcept 8. H/O DVT: Venous doppler + chronic DVT, negative for acute DVT.   Rita H. Owens Shark, NP-C 09/08/2015, 1:16 PM  [facility] [id number]   Pt seen, examined and agree w A/P as above. ESRD patient with refractory anemia for several months. Hemolysis labs sent from HD this week, will check to see if these are back.  Probably needs hematology evaluation which can be done as outpatient in this stable lady.  Got one unit prbc last night for Hb 6.7, may get more blood with HD tonight depending on Hb.  Kelly Splinter MD [employer] pager [phone]    cell [phone] 09/08/2015, 4:39  PM

## 2015-09-08 NOTE — Progress Notes (Signed)
Initial Nutrition Assessment  DOCUMENTATION CODES:   Obesity unspecified  INTERVENTION:  Provide Nepro Shake po BID, each supplement provides 425 kcal and 19 grams protein.  Encourage adequate PO intake.   NUTRITION DIAGNOSIS:   Increased nutrient needs related to chronic illness as evidenced by estimated needs.  GOAL:   Patient will meet greater than or equal to 90% of their needs  MONITOR:   PO intake, Supplement acceptance, Weight trends, Labs, I & O's  REASON FOR ASSESSMENT:   Malnutrition Screening Tool    ASSESSMENT:   59 y.o. female with ESRD dialysis MWF. Becoming more anemic with weight loss over past 2 months. Transfused at dialysis Oct 13th. Has black stools, but these have been black for a long time. Multiple guiac exams are negative for GIB (including in ED today). Had CT abd/pelvis in Aug which was negative. Had worsening swelling in legs over past couple of days, worsening SOB. At dialysis yesterday lab work showed HGB in the 6 range. She has been sent in for transfusion.  Pt reports having a decreased appetite which has been ongoing over the past 3-4 months however reports still consuming 3 meals a day. No percent meal completion recorded, however pt reports 50% po intake this AM. Pt reports weight loss with usual body weight of ~200 lbs last weighing in April of this year. Pt with a 18% weight loss in 6 months. Pt reports unsure of reason for weight loss. She does reports she consumes a protein shake on occasion when po intake is poor. Pt is agreeable to Nepro while admitted. RD to order. Pt was encouraged to eat her food at meals.   Pt with no observed significant fat or muscle mass loss.   Labs: Low potassium, chloride, calcium, AST, GFR. High BUN, creatinine, total bilirubin, alkaline phosphatase.   Diet Order:  Diet Heart Room service appropriate?: Yes; Fluid consistency:: Thin  Skin:   (+3 LE edema)  Last BM:  10/20  Height:   Ht Readings  from Last 1 Encounters:  09/07/15 5\' 1"  (1.549 m)    Weight:   Wt Readings from Last 1 Encounters:  09/07/15 164 lb 12.8 oz (74.753 kg)    Ideal Body Weight:  47.7 kg  BMI:  Body mass index is 31.15 kg/(m^2).  Estimated Nutritional Needs:   Kcal:  1850-2050  Protein:  90-100 grams  Fluid:  Per MD  EDUCATION NEEDS:   No education needs identified at this time  Roslyn SmilingStephanie Marae Cottrell, MS, RD, LDN Pager # [phone] After hours/ weekend pager # [phone]

## 2015-09-09 DIAGNOSIS — N186 End stage renal disease: Secondary | ICD-10-CM | POA: Diagnosis not present

## 2015-09-09 DIAGNOSIS — M25512 Pain in left shoulder: Secondary | ICD-10-CM

## 2015-09-09 DIAGNOSIS — R600 Localized edema: Secondary | ICD-10-CM | POA: Diagnosis not present

## 2015-09-09 DIAGNOSIS — D649 Anemia, unspecified: Secondary | ICD-10-CM | POA: Diagnosis not present

## 2015-09-09 DIAGNOSIS — R630 Anorexia: Secondary | ICD-10-CM

## 2015-09-09 DIAGNOSIS — M329 Systemic lupus erythematosus, unspecified: Secondary | ICD-10-CM | POA: Diagnosis not present

## 2015-09-09 LAB — BASIC METABOLIC PANEL
Anion gap: 12 (ref 5–15)
BUN: 21 mg/dL — AB (ref 6–20)
CHLORIDE: 101 mmol/L (ref 101–111)
CO2: 28 mmol/L (ref 22–32)
CREATININE: 3.34 mg/dL — AB (ref 0.44–1.00)
Calcium: 7.3 mg/dL — ABNORMAL LOW (ref 8.9–10.3)
GFR calc non Af Amer: 14 mL/min — ABNORMAL LOW (ref >60)
GFR, EST AFRICAN AMERICAN: 16 mL/min — AB (ref >60)
Glucose, Bld: 84 mg/dL (ref 65–99)
Potassium: 3.4 mmol/L — ABNORMAL LOW (ref 3.5–5.1)
SODIUM: 141 mmol/L (ref 135–145)

## 2015-09-09 LAB — CBC
HCT: 18.6 % — ABNORMAL LOW (ref 36.0–46.0)
HCT: 24 % — ABNORMAL LOW (ref 36.0–46.0)
HEMOGLOBIN: 7.9 g/dL — AB (ref 12.0–15.0)
Hemoglobin: 6.2 g/dL — CL (ref 12.0–15.0)
MCH: 29.2 pg (ref 26.0–34.0)
MCH: 29.3 pg (ref 26.0–34.0)
MCHC: 33.3 g/dL (ref 30.0–36.0)
MCHC: 32.9 g/dL (ref 30.0–36.0)
MCV: 87.7 fL (ref 78.0–100.0)
MCV: 88.9 fL (ref 78.0–100.0)
PLATELETS: 207 10*3/uL (ref 150–400)
Platelets: 218 K/uL (ref 150–400)
RBC: 2.12 MIL/uL — ABNORMAL LOW (ref 3.87–5.11)
RBC: 2.7 MIL/uL — AB (ref 3.87–5.11)
RDW: 19.7 % — ABNORMAL HIGH (ref 11.5–15.5)
RDW: 17.9 % — ABNORMAL HIGH (ref 11.5–15.5)
WBC: 6.5 K/uL (ref 4.0–10.5)
WBC: 7.4 10*3/uL (ref 4.0–10.5)

## 2015-09-09 LAB — SAVE SMEAR

## 2015-09-09 MED ORDER — LIDOCAINE HCL (PF) 1 % IJ SOLN: 5.0000 mL | INTRAMUSCULAR | Status: DC | PRN

## 2015-09-09 MED ORDER — HEPARIN SODIUM (PORCINE) 1000 UNIT/ML DIALYSIS: 1000.0000 [IU] | INTRAMUSCULAR | Status: DC | PRN

## 2015-09-09 MED ORDER — SODIUM CHLORIDE 0.9 % IV SOLN: 100.0000 mL | INTRAVENOUS | Status: DC | PRN

## 2015-09-09 MED ORDER — ALTEPLASE 2 MG IJ SOLR: 2.0000 mg | Freq: Once | INTRAMUSCULAR | Status: DC | PRN

## 2015-09-09 MED ORDER — LIDOCAINE-PRILOCAINE 2.5-2.5 % EX CREA: 1.0000 "application " | TOPICAL_CREAM | CUTANEOUS | Status: DC | PRN

## 2015-09-09 MED ORDER — NEPRO/CARBSTEADY PO LIQD: 237.0000 mL | Freq: Two times a day (BID) | ORAL | Status: DC

## 2015-09-09 MED ORDER — PENTAFLUOROPROP-TETRAFLUOROETH EX AERO: 1.0000 "application " | INHALATION_SPRAY | CUTANEOUS | Status: DC | PRN

## 2015-09-09 NOTE — Progress Notes (Signed)
[facility] Progress Note  Subjective: "I feel better, but my shoulder hurts".  C/Os of old rotator cuff injury. No acute issues reported  Objective Filed Vitals:   09/09/15 0230 09/09/15 0252 09/09/15 0500 09/09/15 0749  BP: 124/76 108/61 132/65 113/62  Pulse: 84 96 88 101  Temp: 98.4 F (36.9 C) 98.8 F (37.1 C) 99.3 F (37.4 C) 99.3 F (37.4 C)  TempSrc: Oral   Oral  Resp: 16 18 17 17   Height:      Weight:  74.844 kg (165 lb)    SpO2:  100% 99% 99%   Physical Exam General: Alert, cooperative NAD Heart: S1,S2, RRR no M/G/R Lungs: CTA AP Abdomen:  Soft nontender Extremities: 2+ pitting edema bilateral LE. pulses intact Dialysis Access: LUA AVF + thrill + Bruit  Dialysis Orders: Center:[facility] on MWF . EDW 75 kg HD Bath 2.0 Time 4 hours Heparin 5000 units q tx. Access LUA AVF BFR 400 DFR 800  Mircera 200 mcg IV q 2 weeks (last dose 09/01/2015) Calcitriol 1.5 mcg PO MWF (last dose 09/06/2015) Additional Objective Labs: Basic Metabolic Panel:  Recent Labs Lab 09/07/15 1857 09/08/15 0312 09/09/15 0456  NA 139 139 141  K 3.2* 3.0* 3.4*  CL 101 100* 101  CO2 26 28 28   GLUCOSE 137* 104* 84  BUN 31* 40* 21*  CREATININE 4.18* 4.99* 3.34*  CALCIUM 8.0* 7.1* 7.3*   Liver Function Tests:  Recent Labs Lab 09/08/15 0312  AST 13*  ALT 28  ALKPHOS 149*  BILITOT 1.3*  PROT 4.2*  ALBUMIN 1.9*   No results for input(s): LIPASE, AMYLASE in the last 168 hours. CBC:  Recent Labs Lab 09/07/15 1857 09/09/15 0128 09/09/15 0456  WBC 7.5 6.5 7.4  HGB 6.7* 6.2* 7.9*  HCT 21.0* 18.6* 24.0*  MCV 93.8 87.7 88.9  PLT 254 218 207   Blood Culture    Component Value Date/Time   SDES URINE, CATHETERIZED 03/13/2010 2217   SPECREQUEST IMMUNE:NORM UT SYMPT:NEG 03/13/2010 2217   CULT ESCHERICHIA COLI 03/13/2010 2217   REPTSTATUS 03/15/2010 FINAL 03/13/2010 2217    Cardiac Enzymes: No results for input(s): CKTOTAL, CKMB, CKMBINDEX, TROPONINI in the  last 168 hours. CBG: No results for input(s): GLUCAP in the last 168 hours. Iron Studies:  Recent Labs  09/07/15 2120  IRON 22*  TIBC 118*  FERRITIN 2411*   @[email or web address] @ Studies/Results: Dg Chest 2 View  09/07/2015  CLINICAL DATA:  Acute onset of anemia. Weakness and cough. Initial encounter. EXAM: CHEST  2 VIEW COMPARISON:  Chest radiograph performed 09/20/2010 FINDINGS: The lungs are well-aerated. Mild left basilar scarring is noted. There is no evidence of focal opacification, pleural effusion or pneumothorax. The heart is borderline normal in size. No acute osseous abnormalities are seen. A vascular stent is noted overlying the proximal left arm. There is chronic superior subluxation of the right humeral head, likely reflecting remote rotator cuff tear. IMPRESSION: Mild left basilar scarring noted. No acute cardiopulmonary process seen. Electronically Signed   By: Roanna RaiderJeffery  Chang M.D.   On: 09/07/2015 21:14   Medications:   . allopurinol  150 mg Oral Daily  . aspirin EC  81 mg Oral Daily  . feeding supplement (NEPRO CARB STEADY)  237 mL Oral BID BM  . heparin  5,000 Units Subcutaneous 3 times per day  . megestrol  200 mg Oral Daily  . multivitamin  1 tablet Oral QHS  . mycophenolate  500 mg Oral BID  . pantoprazole  40 mg  Oral BID  . predniSONE  20 mg Oral Q breakfast  . vitamin B-12  250 mcg Oral Daily   1. Symptomatic Anemia: Received 1 unit of PRBCs upon admission. Repeat HGB in HD 6.2 rec'd 2nd unit PRBCs. HGB 7.9. Will be Dc'd today and see hematology on OP basis.  2. ESRD - MWF at [facility]. HD today. K+3.0. Use 4.0K bath. 3. Hypertension/volume - No BP meds. HD yesterday. Net UF 2800. Post wt 74.8 kg. At OP EDW.  4. Anemia - As above. 5. Metabolic bone disease - Ca 7.1 C Ca 8.87. Continue calcitriol/binders.  6. Nutrition - Albumin 1.9. Needs renal diet. Protein supplements/renal vit ordered. Dietary consult for protein malnutrition.  7. Lupus: Continue  Prednisone/Cellcept 8. H/O DVT: Venous doppler + chronic DVT, negative for acute DVT.  Disposition:  Home today. Follow up with hematology   Rita H. Brown NP-C 09/09/2015, 9:10 AM  [facility] [phone]  Pt seen, examined and agree w A/P as above.  Vinson Moselle MD [employer] pager [phone]    cell [phone] 09/09/2015, 11:53 AM

## 2015-09-09 NOTE — Discharge Summary (Signed)
Physician Discharge Summary  Kimberly FerriesGloria J [name] ONG:[id number] DOB: 08/18/1956 DOA: 09/07/2015  PCP: Cala BradfordWHITE,CYNTHIA S, MD  Admit date: 09/07/2015 Discharge date: 09/09/2015  Time spent: 45 minutes  Recommendations for Outpatient Follow-up:  Patient will be discharged to home.  Patient will need to follow up with primary care provider within one week of discharge.  Follow up with hematology. Patient should continue medications as prescribed.  Patient should follow a heart healthy diet.  Continue dialysis as scheduled.   Discharge Diagnoses:  Symptomatic anemia ESRD Lower extremity swelling Lupus Decreased appetite Left shoulder pain Hypocalcemia  Discharge Condition: Stable  Diet recommendation: heart healthy  Filed Weights   09/08/15 2006 09/08/15 2300 09/09/15 0252  Weight: 74.39 kg (164 lb) 74.8 kg (164 lb 14.5 oz) 74.844 kg (165 lb)    History of present illness:  on 09/07/2015 by Dr. Lyda PeroneJared Gardner [name] is a 59 y.o. female with ESRD dialysis MWF. Becoming more anemic with weight loss over past 2 months. Transfused at dialysis Oct 13th. Has black stools, but these have been black for a long time. Multiple guiac exams are negative for GIB (including in ED today). Had CT abd/pelvis in Aug which was negative. Had worsening swelling in legs over past couple of days, worsening SOB. At dialysis yesterday lab work showed HGB in the 6 range. She has been sent in for transfusion.  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 Patient has had rotator cuff problems. She states that she will do exercises at home. Declined PT evaluation and imaging.  Hypocalcemia -Managed by nephrology  Procedures  LE doppler  Consults  Nephrology  Discharge Exam: Filed Vitals:   09/09/15 0749  BP: 113/62  Pulse: 101  Temp: 99.3 F (37.4 C)  Resp: 17    Exam  General: Well developed, well nourished, NAD  HEENT: NCAT, mucous membranes moist.   Cardiovascular: S1 S2 auscultated, Regular rate and rhythm. 2/6SEM  Respiratory: Clear to auscultation bilaterally with equal chest rise  Abdomen: Soft, obese, nontender, nondistended, + bowel sounds  Extremities: warm dry without cyanosis clubbing. +LE edema. LUE AVF +B/T  Neuro: AAOx3, nonfocal  Psych: Normal affect and demeanor  Discharge Instructions      Discharge Instructions    Discharge instructions    Complete by:  As directed   Patient will be discharged to home.  Patient will need to follow up with primary care provider within one week of discharge.  Follow up with hematology. Patient should continue medications as prescribed.  Patient should follow a heart healthy diet.  Continue dialysis as scheduled.            Medication List    TAKE these medications  allopurinol 300 MG tablet  Commonly known as:  ZYLOPRIM  Take 150 mg by mouth daily.     aspirin 81 MG tablet  Take 81 mg by mouth daily.     feeding supplement (NEPRO CARB STEADY) Liqd  Take 237 mLs by mouth 2 (two) times daily between  meals.     folic acid-vitamin b complex-vitamin c-selenium-zinc 3 MG Tabs tablet  Take 1 tablet by mouth daily.     HYDROcodone-acetaminophen 10-325 MG tablet  Commonly known as:  NORCO  Take 1 tablet by mouth every 6 (six) hours as needed for moderate pain or severe pain.     megestrol 40 MG/ML suspension  Commonly known as:  MEGACE  Take 200 mg by mouth daily.     mycophenolate 500 MG tablet  Commonly known as:  CELLCEPT  Take 500 mg by mouth 2 (two) times daily.     pantoprazole 40 MG tablet  Commonly known as:  PROTONIX  Take 40 mg by mouth 2 (two) times daily.     predniSONE 20 MG tablet  Commonly known as:  DELTASONE  Take 20 mg by mouth daily with breakfast.     vitamin B-12 250 MCG tablet  Commonly known as:  CYANOCOBALAMIN  Take 250 mcg by mouth daily.       No Known Allergies Follow-up Information    Follow up with WHITE,CYNTHIA S, MD. Schedule an appointment as soon as possible for a visit in 1 week.   Specialty:  Family Medicine   Why:  Hospital follow up   Contact information:   [address] [place] [address] [phone]       Follow up with Josph Macho, MD. Schedule an appointment as soon as possible for a visit in 1 week.   Specialty:  Oncology   Why:  Hospital follow up, anemia   Contact information:   [address] [place] [address] [phone]        The results of significant diagnostics from this hospitalization (including imaging, microbiology, ancillary and laboratory) are listed below for reference.    Significant Diagnostic Studies: Dg Chest 2 View  09/07/2015  CLINICAL DATA:  Acute onset of anemia. Weakness and cough. Initial encounter. EXAM: CHEST  2 VIEW COMPARISON:  Chest radiograph performed 09/20/2010 FINDINGS: The lungs are well-aerated. Mild left basilar scarring is noted. There is no evidence of focal opacification, pleural effusion or pneumothorax. The heart is borderline normal  in size. No acute osseous abnormalities are seen. A vascular stent is noted overlying the proximal left arm. There is chronic superior subluxation of the right humeral head, likely reflecting remote rotator cuff tear. IMPRESSION: Mild left basilar scarring noted. No acute cardiopulmonary process seen. Electronically Signed   By: Roanna Raider M.D.   On: 09/07/2015 21:14    Microbiology: Recent Results (from the past 240 hour(s))  MRSA PCR Screening     Status: None   Collection Time: 09/07/15 10:47 PM  Result Value Ref Range Status   MRSA by PCR NEGATIVE NEGATIVE Final    Comment:        The GeneXpert MRSA Assay (FDA approved for NASAL specimens only), is one component of a comprehensive MRSA colonization surveillance program. It is not intended to diagnose MRSA infection nor to guide or monitor treatment for MRSA infections.      Labs: Basic Metabolic Panel:  Recent Labs Lab 09/07/15 1857 09/08/15 0312 09/09/15 0456  NA 139 139 141  K 3.2* 3.0* 3.4*  CL 101 100* 101  CO2 GLUCOSE 137* 104* 84  BUN 31* 40* 21*  CREATININE 4.18* 4.99* 3.34*  CALCIUM 8.0* 7.1* 7.3*   Liver Function Tests:  Recent Labs Lab 09/08/15 0312  AST 13*  ALT 28  ALKPHOS 149*  BILITOT 1.3*  PROT 4.2*  ALBUMIN 1.9*   No results for input(s): LIPASE, AMYLASE in the last 168 hours. No results for input(s): AMMONIA in the last 168 hours. CBC:  Recent Labs Lab 09/07/15 1857 09/09/15 0128 09/09/15 0456  WBC 7.5 6.5 7.4  HGB 6.7* 6.2* 7.9*  HCT 21.0* 18.6* 24.0*  MCV 93.8 87.7 88.9  PLT 254 218 207   Cardiac Enzymes: No results for input(s): CKTOTAL, CKMB, CKMBINDEX, TROPONINI in the last 168 hours. BNP: BNP (last 3 results) No results for input(s): BNP in the last 8760 hours.  ProBNP (last 3 results) No results for input(s): PROBNP in the last 8760 hours.  CBG: No results for input(s): GLUCAP in the last 168 hours.     SignedEdsel Petrin  [facility] 09/09/2015, 10:36 AM

## 2015-09-09 NOTE — Progress Notes (Signed)
Discharge instructions and medications discussed with patient.  All questions answered.  

## 2015-09-09 NOTE — Discharge Instructions (Signed)
Anemia, Nonspecific Anemia is a condition in which the concentration of red blood cells or hemoglobin in the blood is below normal. Hemoglobin is a substance in red blood cells that carries oxygen to the tissues of the body. Anemia results in not enough oxygen reaching these tissues.  CAUSES  Common causes of anemia include:   Excessive bleeding. Bleeding may be internal or external. This includes excessive bleeding from periods (in women) or from the intestine.   Poor nutrition.   Chronic kidney, thyroid, and liver disease.  Bone marrow disorders that decrease red blood cell production.  Cancer and treatments for cancer.  HIV, AIDS, and their treatments.  Spleen problems that increase red blood cell destruction.  Blood disorders.  Excess destruction of red blood cells due to infection, medicines, and autoimmune disorders. SIGNS AND SYMPTOMS   Minor weakness.   Dizziness.   Headache.  Palpitations.   Shortness of breath, especially with exercise.   Paleness.  Cold sensitivity.  Indigestion.  Nausea.  Difficulty sleeping.  Difficulty concentrating. Symptoms may occur suddenly or they may develop slowly.  DIAGNOSIS  Additional blood tests are often needed. These help your health care provider determine the best treatment. Your health care provider will check your stool for blood and look for other causes of blood loss.  TREATMENT  Treatment varies depending on the cause of the anemia. Treatment can include:   Supplements of iron, vitamin B12, or folic acid.   Hormone medicines.   A blood transfusion. This may be needed if blood loss is severe.   Hospitalization. This may be needed if there is significant continual blood loss.   Dietary changes.  Spleen removal. HOME CARE INSTRUCTIONS Keep all follow-up appointments. It often takes many weeks to correct anemia, and having your health care provider check on your condition and your response to  treatment is very important. SEEK IMMEDIATE MEDICAL CARE IF:   You develop extreme weakness, shortness of breath, or chest pain.   You become dizzy or have trouble concentrating.  You develop heavy vaginal bleeding.   You develop a rash.   You have bloody or black, tarry stools.   You faint.   You vomit up blood.   You vomit repeatedly.   You have abdominal pain.  You have a fever or persistent symptoms for more than 2-3 days.   You have a fever and your symptoms suddenly get worse.   You are dehydrated.  MAKE SURE YOU:  Understand these instructions.  Will watch your condition.  Will get help right away if you are not doing well or get worse.   This information is not intended to replace advice given to you by your health care provider. Make sure you discuss any questions you have with your health care provider.   Document Released: 12/12/2004 Document Revised: 07/07/2013 Document Reviewed: 04/30/2013 Elsevier Interactive Patient Education 2016 [employer]  

## 2015-09-10 LAB — TYPE AND SCREEN
ABO/RH(D): A POS
ANTIBODY SCREEN: NEGATIVE
Unit division: 0
Unit division: 0

## 2015-09-10 LAB — HAPTOGLOBIN: Haptoglobin: 176 mg/dL (ref 34–200)

## 2015-09-29 ENCOUNTER — Inpatient Hospital Stay (HOSPITAL_COMMUNITY)
Admission: EM | Admit: 2015-09-29 | Discharge: 2015-10-01 | DRG: 811 | Disposition: A | Discharge status: Home / Self Care | Payer: 59 | Attending: Internal Medicine | Admitting: Internal Medicine

## 2015-09-29 ENCOUNTER — Encounter (HOSPITAL_COMMUNITY): Payer: Self-pay | Admitting: Family Medicine

## 2015-09-29 DIAGNOSIS — G4733 Obstructive sleep apnea (adult) (pediatric): Secondary | ICD-10-CM | POA: Diagnosis present

## 2015-09-29 DIAGNOSIS — N186 End stage renal disease: Secondary | ICD-10-CM | POA: Diagnosis present

## 2015-09-29 DIAGNOSIS — D849 Immunodeficiency, unspecified: Secondary | ICD-10-CM | POA: Diagnosis not present

## 2015-09-29 DIAGNOSIS — D72829 Elevated white blood cell count, unspecified: Secondary | ICD-10-CM | POA: Diagnosis present

## 2015-09-29 DIAGNOSIS — Z87891 Personal history of nicotine dependence: Secondary | ICD-10-CM | POA: Diagnosis not present

## 2015-09-29 DIAGNOSIS — D649 Anemia, unspecified: Secondary | ICD-10-CM | POA: Diagnosis present

## 2015-09-29 DIAGNOSIS — D638 Anemia in other chronic diseases classified elsewhere: Secondary | ICD-10-CM | POA: Diagnosis present

## 2015-09-29 DIAGNOSIS — R109 Unspecified abdominal pain: Secondary | ICD-10-CM

## 2015-09-29 DIAGNOSIS — E785 Hyperlipidemia, unspecified: Secondary | ICD-10-CM | POA: Diagnosis present

## 2015-09-29 DIAGNOSIS — Z992 Dependence on renal dialysis: Secondary | ICD-10-CM | POA: Diagnosis not present

## 2015-09-29 DIAGNOSIS — M109 Gout, unspecified: Secondary | ICD-10-CM | POA: Diagnosis present

## 2015-09-29 DIAGNOSIS — IMO0002 Reserved for concepts with insufficient information to code with codable children: Secondary | ICD-10-CM | POA: Diagnosis present

## 2015-09-29 DIAGNOSIS — K921 Melena: Secondary | ICD-10-CM | POA: Diagnosis present

## 2015-09-29 DIAGNOSIS — Z7982 Long term (current) use of aspirin: Secondary | ICD-10-CM | POA: Diagnosis not present

## 2015-09-29 DIAGNOSIS — D62 Acute posthemorrhagic anemia: Principal | ICD-10-CM | POA: Diagnosis present

## 2015-09-29 DIAGNOSIS — E876 Hypokalemia: Secondary | ICD-10-CM | POA: Diagnosis not present

## 2015-09-29 DIAGNOSIS — Z9884 Bariatric surgery status: Secondary | ICD-10-CM | POA: Diagnosis not present

## 2015-09-29 DIAGNOSIS — M329 Systemic lupus erythematosus, unspecified: Secondary | ICD-10-CM | POA: Diagnosis present

## 2015-09-29 DIAGNOSIS — M1000 Idiopathic gout, unspecified site: Secondary | ICD-10-CM | POA: Diagnosis not present

## 2015-09-29 DIAGNOSIS — K219 Gastro-esophageal reflux disease without esophagitis: Secondary | ICD-10-CM | POA: Diagnosis present

## 2015-09-29 DIAGNOSIS — N2581 Secondary hyperparathyroidism of renal origin: Secondary | ICD-10-CM | POA: Diagnosis present

## 2015-09-29 DIAGNOSIS — I120 Hypertensive chronic kidney disease with stage 5 chronic kidney disease or end stage renal disease: Secondary | ICD-10-CM | POA: Diagnosis not present

## 2015-09-29 DIAGNOSIS — Z79899 Other long term (current) drug therapy: Secondary | ICD-10-CM

## 2015-09-29 DIAGNOSIS — Z6830 Body mass index (BMI) 30.0-30.9, adult: Secondary | ICD-10-CM | POA: Diagnosis not present

## 2015-09-29 DIAGNOSIS — Z7952 Long term (current) use of systemic steroids: Secondary | ICD-10-CM

## 2015-09-29 LAB — PREPARE RBC (CROSSMATCH)

## 2015-09-29 LAB — COMPREHENSIVE METABOLIC PANEL
ALK PHOS: 103 U/L (ref 38–126)
ALT: 16 U/L (ref 14–54)
AST: 24 U/L (ref 15–41)
Albumin: 2.2 g/dL — ABNORMAL LOW (ref 3.5–5.0)
Anion gap: 11 (ref 5–15)
BILIRUBIN TOTAL: 1.1 mg/dL (ref 0.3–1.2)
BUN: 16 mg/dL (ref 6–20)
CALCIUM: 9.8 mg/dL (ref 8.9–10.3)
CO2: 26 mmol/L (ref 22–32)
CREATININE: 2.42 mg/dL — AB (ref 0.44–1.00)
Chloride: 101 mmol/L (ref 101–111)
GFR calc non Af Amer: 21 mL/min — ABNORMAL LOW (ref >60)
GFR, EST AFRICAN AMERICAN: 24 mL/min — AB (ref >60)
GLUCOSE: 106 mg/dL — AB (ref 65–99)
Potassium: 3.3 mmol/L — ABNORMAL LOW (ref 3.5–5.1)
Sodium: 138 mmol/L (ref 135–145)
TOTAL PROTEIN: 5.3 g/dL — AB (ref 6.5–8.1)

## 2015-09-29 LAB — CBC
HCT: 21.1 % — ABNORMAL LOW (ref 36.0–46.0)
HEMOGLOBIN: 6.6 g/dL — AB (ref 12.0–15.0)
MCH: 28.8 pg (ref 26.0–34.0)
MCHC: 31.3 g/dL (ref 30.0–36.0)
MCV: 92.1 fL (ref 78.0–100.0)
PLATELETS: 430 10*3/uL — AB (ref 150–400)
RBC: 2.29 MIL/uL — ABNORMAL LOW (ref 3.87–5.11)
RDW: 19.4 % — ABNORMAL HIGH (ref 11.5–15.5)
WBC: 15.4 10*3/uL — ABNORMAL HIGH (ref 4.0–10.5)

## 2015-09-29 LAB — POC OCCULT BLOOD, ED: FECAL OCCULT BLD: POSITIVE — AB

## 2015-09-29 MED ORDER — ONDANSETRON HCL 4 MG PO TABS
4.0000 mg | ORAL_TABLET | Freq: Four times a day (QID) | ORAL | Status: DC | PRN
Start: 2015-09-29 — End: 2015-10-01

## 2015-09-29 MED ORDER — ONDANSETRON HCL 4 MG/2ML IJ SOLN: 4.0000 mg | Freq: Four times a day (QID) | INTRAMUSCULAR | Status: DC | PRN

## 2015-09-29 MED ORDER — HEPARIN SODIUM (PORCINE) 5000 UNIT/ML IJ SOLN: 5000.0000 [IU] | Freq: Three times a day (TID) | INTRAMUSCULAR | Status: DC

## 2015-09-29 MED ORDER — ASPIRIN 81 MG PO TABS: 81.0000 mg | ORAL_TABLET | Freq: Every day | ORAL | Status: DC

## 2015-09-29 MED ORDER — POTASSIUM CHLORIDE CRYS ER 20 MEQ PO TBCR
40.0000 meq | EXTENDED_RELEASE_TABLET | Freq: Once | ORAL | Status: AC
  Administered 2015-09-29: 40 meq via ORAL
  Filled 2015-09-29: qty 2

## 2015-09-29 MED ORDER — ASPIRIN EC 81 MG PO TBEC
81.0000 mg | DELAYED_RELEASE_TABLET | Freq: Every day | ORAL | Status: DC
  Administered 2015-09-30: 81 mg via ORAL
  Filled 2015-09-29: qty 1

## 2015-09-29 MED ORDER — MYCOPHENOLATE MOFETIL 250 MG PO CAPS
500.0000 mg | ORAL_CAPSULE | Freq: Two times a day (BID) | ORAL | Status: DC
  Administered 2015-09-30 – 2015-10-01 (×3): 500 mg via ORAL
  Filled 2015-09-29 (×4): qty 2

## 2015-09-29 MED ORDER — MYCOPHENOLATE MOFETIL 500 MG PO TABS: 500.0000 mg | ORAL_TABLET | Freq: Two times a day (BID) | ORAL | Status: DC

## 2015-09-29 MED ORDER — PREDNISONE 20 MG PO TABS
20.0000 mg | ORAL_TABLET | Freq: Every day | ORAL | Status: DC
  Administered 2015-09-30 – 2015-10-01 (×2): 20 mg via ORAL
  Filled 2015-09-29 (×2): qty 1

## 2015-09-29 MED ORDER — ALLOPURINOL 300 MG PO TABS
150.0000 mg | ORAL_TABLET | Freq: Every day | ORAL | Status: DC
  Administered 2015-09-30 – 2015-10-01 (×2): 150 mg via ORAL
  Filled 2015-09-29 (×2): qty 1

## 2015-09-29 MED ORDER — MEGESTROL ACETATE 40 MG/ML PO SUSP
200.0000 mg | Freq: Every day | ORAL | Status: DC
  Administered 2015-09-30 – 2015-10-01 (×2): 200 mg via ORAL
  Filled 2015-09-29 (×3): qty 5

## 2015-09-29 MED ORDER — SODIUM CHLORIDE 0.9 % IJ SOLN
3.0000 mL | Freq: Two times a day (BID) | INTRAMUSCULAR | Status: DC
  Administered 2015-09-30 – 2015-10-01 (×3): 3 mL via INTRAVENOUS

## 2015-09-29 MED ORDER — RENA-VITE PO TABS
1.0000 | ORAL_TABLET | Freq: Every day | ORAL | Status: DC
  Administered 2015-09-30: 1 via ORAL
  Filled 2015-09-29 (×2): qty 1

## 2015-09-29 MED ORDER — DIALYVITE 3000 3 MG PO TABS: 1.0000 | ORAL_TABLET | Freq: Every day | ORAL | Status: DC

## 2015-09-29 MED ORDER — HYDROCODONE-ACETAMINOPHEN 5-325 MG PO TABS
1.0000 | ORAL_TABLET | Freq: Four times a day (QID) | ORAL | Status: DC | PRN
Start: 2015-09-29 — End: 2015-10-01
  Administered 2015-09-29 – 2015-10-01 (×5): 1 via ORAL
  Filled 2015-09-29 (×5): qty 1

## 2015-09-29 MED ORDER — VITAMIN B-12 100 MCG PO TABS
250.0000 ug | ORAL_TABLET | Freq: Every day | ORAL | Status: DC
  Administered 2015-09-30 – 2015-10-01 (×2): 250 ug via ORAL
  Filled 2015-09-29 (×2): qty 3

## 2015-09-29 MED ORDER — HYDROCODONE-ACETAMINOPHEN 5-325 MG PO TABS: 1.0000 | ORAL_TABLET | ORAL | Status: DC | PRN

## 2015-09-29 MED ORDER — SODIUM CHLORIDE 0.9 % IV SOLN: Freq: Once | INTRAVENOUS | Status: DC

## 2015-09-29 MED ORDER — VITAMIN B-12 250 MCG PO TABS: 250.0000 ug | ORAL_TABLET | Freq: Every day | ORAL | Status: DC

## 2015-09-29 MED ORDER — PANTOPRAZOLE SODIUM 40 MG PO TBEC
40.0000 mg | DELAYED_RELEASE_TABLET | Freq: Two times a day (BID) | ORAL | Status: DC
  Administered 2015-09-29 – 2015-10-01 (×4): 40 mg via ORAL
  Filled 2015-09-29 (×4): qty 1

## 2015-09-29 NOTE — ED Notes (Signed)
Pt placed in gown, on pulse ox, bp cuff, and cardiac monitor. Hemoccult card bedside.

## 2015-09-29 NOTE — ED Notes (Signed)
Lower hgb called   From lab

## 2015-09-29 NOTE — ED Notes (Signed)
MD at bedside. 

## 2015-09-29 NOTE — ED Notes (Signed)
Pt here for blood transfusion. Told hgb was 6. Hx of same with recent admission and blood transfusion. sts some weakness. Denies pain.

## 2015-09-29 NOTE — ED Provider Notes (Signed)
CSN: [id number]     Arrival date & time 09/29/15  1806 History   First MD Initiated Contact with Patient 09/29/15 1945     Chief Complaint  Patient presents with  . low hgb      (Consider location/radiation/quality/duration/timing/severity/associated sxs/prior Treatment) Patient is a 59 y.o. female presenting with general illness. The history is provided by the patient.  Illness Severity:  Mild Onset quality:  Gradual Duration:  2 days Timing:  Constant Progression:  Unchanged Chronicity:  New Associated symptoms: fatigue   Associated symptoms: no chest pain, no congestion, no fever, no headaches, no myalgias, no nausea, no rhinorrhea, no shortness of breath, no vomiting and no wheezing    59 yo F with a chief complaint of weakness. This been going on for quite some time. Patient has a history of recurrent anemia thought to be secondarily due to her lupus. Patient has been hospitalized multiple times in the recent past for the same. Patient has had dark stools also for quite some time. Denies any change. Denies any fevers or chills. Patient has had some off and on abdominal pain which has been chronic over many months. Patient feels like that is also unchanged.  Past Medical History  Diagnosis Date  . Lupus (HCC) lupus with nephritis  . Chronic kidney disease   . Morbid obesity (HCC)   . DVT (deep venous thrombosis) (HCC)   . Gout   . Anemia   . Tachycardia   . GI (gastrointestinal bleed)   . Hyperlipidemia   . Gastric bypass status for obesity   . Sleep apnea, obstructive     does not use CPAP   Past Surgical History  Procedure Laterality Date  . Gastric bypass    . Inferior vena cava angioplasty / stenting    . Abdominal hysterectomy    . Laparoscopic gastric bypass  2010  . Av fistula placement  11/06/2011    Procedure: INSERTION OF ARTERIOVENOUS (AV) GORE-TEX GRAFT ARM;  Surgeon: Nilda SimmerBrian Liang-Yu Chen, MD;  [facility];  Service: Vascular;  Laterality: Left;  Susie Cassette.  Venogram N/A 10/31/2011    Procedure: VENOGRAM;  Surgeon: Fransisco HertzBrian L Chen, MD;  [facility];  Service: Cardiovascular;  Laterality: N/A;   Family History  Problem Relation Age of Onset  . Anesthesia problems Neg Hx   . Heart failure Mother   . Cancer Father    Social History  Substance Use Topics  . Smoking status: Former Smoker -- 0.25 packs/day for 20 years    Types: Cigarettes    Quit date: 05/15/2011  . Smokeless tobacco: Never Used  . Alcohol Use: No   OB History    No data available     Review of Systems  Constitutional: Positive for fatigue. Negative for fever and chills.  HENT: Negative for congestion and rhinorrhea.   Eyes: Negative for redness and visual disturbance.  Respiratory: Negative for shortness of breath and wheezing.   Cardiovascular: Negative for chest pain and palpitations.  Gastrointestinal: Negative for nausea and vomiting.  Genitourinary: Negative for dysuria and urgency.  Musculoskeletal: Negative for myalgias and arthralgias.  Skin: Negative for pallor and wound.  Neurological: Negative for dizziness and headaches.      Allergies  Review of patient's allergies indicates no known allergies.  Home Medications   Prior to Admission medications   Medication Sig Start Date End Date Taking? Authorizing Provider  allopurinol (ZYLOPRIM) 300 MG tablet Take 150 mg by mouth daily.  09/28/11  Yes  Historical Provider, MD  aspirin 81 MG tablet Take 81 mg by mouth daily.   Yes Historical Provider, MD  folic acid-vitamin b complex-vitamin c-selenium-zinc (DIALYVITE) 3 MG TABS tablet Take 1 tablet by mouth daily.   Yes Historical Provider, MD  megestrol (MEGACE) 40 MG/ML suspension Take 200 mg by mouth daily.   Yes Historical Provider, MD  mycophenolate (CELLCEPT) 500 MG tablet Take 500 mg by mouth 2 (two) times daily.   Yes Historical Provider, MD  pantoprazole (PROTONIX) 40 MG tablet Take 40 mg by mouth 2 (two) times daily.    Yes Historical Provider,  MD  predniSONE (DELTASONE) 20 MG tablet Take 20 mg by mouth daily with breakfast.   Yes Historical Provider, MD  vitamin B-12 (CYANOCOBALAMIN) 250 MCG tablet Take 250 mcg by mouth daily.     Yes Historical Provider, MD   BP 118/68 mmHg  Pulse 103  Temp(Src) 98.8 F (37.1 C) (Oral)  Resp 16  SpO2 100% Physical Exam  Constitutional: She is oriented to person, place, and time. She appears well-developed and well-nourished. No distress.  HENT:  Head: Normocephalic and atraumatic.  Eyes: EOM are normal. Pupils are equal, round, and reactive to light.  Conjunctival pallor  Neck: Normal range of motion. Neck supple.  Cardiovascular: Normal rate and regular rhythm.  Exam reveals no gallop and no friction rub.   No murmur heard. Pulmonary/Chest: Effort normal. She has no wheezes. She has no rales.  Abdominal: Soft. She exhibits no distension. There is no tenderness. There is no rebound and no guarding.  Genitourinary: Rectal exam shows no external hemorrhoid, no internal hemorrhoid, no fissure, no mass and no tenderness. Guaiac positive stool.  Soft brown stool  Musculoskeletal: She exhibits no edema or tenderness.  Neurological: She is alert and oriented to person, place, and time.  Skin: Skin is warm and dry. She is not diaphoretic.  Psychiatric: She has a normal mood and affect. Her behavior is normal.  Nursing note and vitals reviewed.   ED Course  Procedures (including critical care time) Labs Review Labs Reviewed  COMPREHENSIVE METABOLIC PANEL - Abnormal; Notable for the following:    Potassium 3.3 (*)    Glucose, Bld 106 (*)    Creatinine, Ser 2.42 (*)    Total Protein 5.3 (*)    Albumin 2.2 (*)    GFR calc non Af Amer 21 (*)    GFR calc Af Amer 24 (*)    All other components within normal limits  CBC - Abnormal; Notable for the following:    WBC 15.4 (*)    RBC 2.29 (*)    Hemoglobin 6.6 (*)    HCT 21.1 (*)    RDW 19.4 (*)    Platelets 430 (*)    All other components  within normal limits  POC OCCULT BLOOD, ED - Abnormal; Notable for the following:    Fecal Occult Bld POSITIVE (*)    All other components within normal limits  TYPE AND SCREEN  PREPARE RBC (CROSSMATCH)    Imaging Review No results found. I have personally reviewed and evaluated these images and lab results as part of my medical decision-making.   EKG Interpretation None      MDM   Final diagnoses:  Symptomatic anemia    59 yo F with a chief complaint of anemia. Will admit to the hospital for transfusion. Patient had a leukocytosis of 15.4. Not otherwise seen on her prior labs. Abdominal pain isn't significantly increased. Feel no need  to CT scan at this time. Admit.  The patients results and plan were reviewed and discussed.   Any x-rays performed were independently reviewed by myself.   Differential diagnosis were considered with the presenting HPI.  Medications  0.9 %  sodium chloride infusion (not administered)  potassium chloride SA (K-DUR,KLOR-CON) CR tablet 40 mEq (40 mEq Oral Given 09/29/15 2038)    Filed Vitals:   09/29/15 1836 09/29/15 1950 09/29/15 2033 09/29/15 2040  BP: 108/66 114/65 118/68 118/68  Pulse: 111 102 103 103  Temp: 97.7 F (36.5 C)  98.8 F (37.1 C) 98.8 F (37.1 C)  TempSrc: Oral  Oral Oral  Resp: SpO2: 100% 98% 100% 100%    Final diagnoses:  Symptomatic anemia    Admission/ observation were discussed with the admitting physician, patient and/or family and they are comfortable with the plan.      Melene Plan, DO 09/29/15 2048

## 2015-09-29 NOTE — ED Notes (Signed)
Pt presents to ED after receiving a call from her dialysis center, stating that her hgb was low and that she needed another transfusion. Pt states that over the past several months her lupus has been causing a lot of problems, leading to incr'd weakness, decr'd appetite. Denies LOC, dizziness, blood noticed in urine or stool.

## 2015-09-29 NOTE — H&P (Addendum)
[facility] History and Physical  Kimberly FerriesGloria J [name] ZOX:[id number] DOB: 10/29/1956 DOA: 09/29/2015  Referring physician: ED PCP: Cala BradfordWHITE,CYNTHIA S, MD   Chief Complaint: "The dialysis center told me to come in because my blood counts were low"  HPI:  59 year old female w/ past medical history significant for Lupus w/ end-stage renal disease on HD M/W/F, gout, anemia, obesity s/p gastric bypass; who presents after being called by the dialysis unit today stating that her blood counts were low and to proceed to the ED to get another blood transfusion. She states that she has had issues with her blood counts for some time and attributes it to her history of lupus which also caused her kidney problems. Patient states previous history of dark stools when she had a GI bleed previously. However, states that she's having normal stools at this time that are brown. She does endorse intermittent left lower quadrant abdominal pain, chills, fatigue, 40 pound weight loss in the last 3 months, and early satiety. Patient denies any fever, chest pain, and chest pain, muscle aches, shortness of breath, or feelings that she is sick. The ED physician performed a rectal exam and found this to be guaiac positive.    Review of Systems  Constitutional: Positive for chills. Negative for fever.  HENT: Negative for congestion and ear pain.   Eyes: Negative for photophobia and pain.  Respiratory: Negative for hemoptysis, shortness of breath and wheezing.   Cardiovascular: Positive for palpitations and leg swelling. Negative for chest pain.  Gastrointestinal: Positive for abdominal pain. Negative for vomiting and blood in stool.  Genitourinary: Negative for urgency, frequency and flank pain.  Musculoskeletal: Positive for joint pain. Negative for falls.  Skin: Negative for itching and rash.  Neurological: Negative for tingling and tremors.  Endo/Heme/Allergies: Negative for polydipsia. Bruises/bleeds easily.   Psychiatric/Behavioral: Negative for suicidal ideas, hallucinations and substance abuse.      Past Medical History  Diagnosis Date  . Lupus (HCC) lupus with nephritis  . Chronic kidney disease   . Morbid obesity (HCC)   . DVT (deep venous thrombosis) (HCC)   . Gout   . Anemia   . Tachycardia   . GI (gastrointestinal bleed)   . Hyperlipidemia   . Gastric bypass status for obesity   . Sleep apnea, obstructive     does not use CPAP     Past Surgical History  Procedure Laterality Date  . Gastric bypass    . Inferior vena cava angioplasty / stenting    . Abdominal hysterectomy    . Laparoscopic gastric bypass  2010  . Av fistula placement  11/06/2011    Procedure: INSERTION OF ARTERIOVENOUS (AV) GORE-TEX GRAFT ARM;  Surgeon: Nilda SimmerBrian Liang-Yu Chen, MD;  [facility];  Service: Vascular;  Laterality: Left;  Susie Cassette. Venogram N/A 10/31/2011    Procedure: VENOGRAM;  Surgeon: Fransisco HertzBrian L Chen, MD;  [facility];  Service: Cardiovascular;  Laterality: N/A;      Social History:  reports that she quit smoking about 4 years ago. Her smoking use included Cigarettes. She has a 5 pack-year smoking history. She has never used smokeless tobacco. She reports that she does not drink alcohol or use illicit drugs. Where does patient live--home and with whom if at home? Can patient participate in ADLs? Yes  No Known Allergies  Family History  Problem Relation Age of Onset  . Anesthesia problems Neg Hx   . Heart failure Mother   . Cancer Father  Prior to Admission medications   Medication Sig Start Date End Date Taking? Authorizing Provider  allopurinol (ZYLOPRIM) 300 MG tablet Take 150 mg by mouth daily.  09/28/11  Yes Historical Provider, MD  aspirin 81 MG tablet Take 81 mg by mouth daily.   Yes Historical Provider, MD  folic acid-vitamin b complex-vitamin c-selenium-zinc (DIALYVITE) 3 MG TABS tablet Take 1 tablet by mouth daily.   Yes Historical Provider, MD  megestrol (MEGACE)  40 MG/ML suspension Take 200 mg by mouth daily.   Yes Historical Provider, MD  mycophenolate (CELLCEPT) 500 MG tablet Take 500 mg by mouth 2 (two) times daily.   Yes Historical Provider, MD  pantoprazole (PROTONIX) 40 MG tablet Take 40 mg by mouth 2 (two) times daily.    Yes Historical Provider, MD  predniSONE (DELTASONE) 20 MG tablet Take 20 mg by mouth daily with breakfast.   Yes Historical Provider, MD  vitamin B-12 (CYANOCOBALAMIN) 250 MCG tablet Take 250 mcg by mouth daily.     Yes Historical Provider, MD     Physical Exam: Filed Vitals:   09/29/15 2040 09/29/15 2100 09/29/15 2116 09/29/15 2202  BP: 118/68 112/55 107/65 118/64  Pulse: 103 102 107 100  Temp: 98.8 F (37.1 C)  98.7 F (37.1 C) 99 F (37.2 C)  TempSrc: Oral  Oral   Resp: Height:     (1.549 m)  Weight:    67.132 kg (148 lb)  SpO2: 100% 100% 100% 99%     Constitutional: Vital signs reviewed. Patient is a well-developed and well-nourished in no acute distress and cooperative with exam. Alert and oriented x3.  Head: Normocephalic and atraumatic  Ear: TM normal bilaterally  Mouth: no erythema or exudates, MMM  Eyes: PERRL, EOMI, conjunctivae normal, No scleral icterus.  Neck: Supple, Trachea midline normal ROM, No JVD, mass, thyromegaly, or carotid bruit present.  Cardiovascular: RRR, S1 normal, S2 normal, no MRG, pulses symmetric and intact bilaterally  Pulmonary/Chest: CTAB, no wheezes, rales, or rhonchi  Abdominal: Soft. Mild TTP of the Left  middle quadrant of abdomen, non-distended, bowel sounds are normal, no masses, organomegaly, or guarding present.  GU: no CVA tenderness Musculoskeletal: No joint deformities, erythema, or stiffness, ROM full and no nontender Ext: Edema present of the left hand. Patient with graft near the proximal bicep. No cyanosis, pulses palpable bilaterally (DP and PT)  Hematology: no cervical, inginal, or axillary adenopathy.  Neurological: A&O x3, Strenght is normal  and symmetric bilaterally, cranial nerve II-XII are grossly intact, no focal motor deficit, sensory intact to light touch bilaterally.  Skin: Warm, dry and intact. No rash, cyanosis, or clubbing.  Psychiatric: Normal mood and affect. speech and behavior is normal. Judgment and thought content normal. Cognition and memory are normal.      Data Review   Micro Results No results found for this or any previous visit (from the past 240 hour(s)).  Radiology Reports Dg Chest 2 View  09/07/2015  CLINICAL DATA:  Acute onset of anemia. Weakness and cough. Initial encounter. EXAM: CHEST  2 VIEW COMPARISON:  Chest radiograph performed 09/20/2010 FINDINGS: The lungs are well-aerated. Mild left basilar scarring is noted. There is no evidence of focal opacification, pleural effusion or pneumothorax. The heart is borderline normal in size. No acute osseous abnormalities are seen. A vascular stent is noted overlying the proximal left arm. There is chronic superior subluxation of the right humeral head, likely reflecting remote rotator cuff tear. IMPRESSION: Mild left basilar  scarring noted. No acute cardiopulmonary process seen. Electronically Signed   By: Roanna Raider M.D.   On: 09/07/2015 21:14     CBC  Recent Labs Lab 09/29/15 1903  WBC 15.4*  HGB 6.6*  HCT 21.1*  PLT 430*  MCV 92.1  MCH 28.8  MCHC 31.3  RDW 19.4*    Chemistries   Recent Labs Lab 09/29/15 1903  NA 138  K 3.3*  CL 101  CO2 26  GLUCOSE 106*  BUN 16  CREATININE 2.42*  CALCIUM 9.8  AST 24  ALT 16  ALKPHOS 103  BILITOT 1.1   ------------------------------------------------------------------------------------------------------------------ estimated creatinine clearance is 21.9 mL/min (by C-G formula based on Cr of 2.42). ------------------------------------------------------------------------------------------------------------------ No results for input(s): HGBA1C in the last 72  hours. ------------------------------------------------------------------------------------------------------------------ No results for input(s): CHOL, HDL, LDLCALC, TRIG, CHOLHDL, LDLDIRECT in the last 72 hours. ------------------------------------------------------------------------------------------------------------------ No results for input(s): TSH, T4TOTAL, T3FREE, THYROIDAB in the last 72 hours.  Invalid input(s): FREET3 ------------------------------------------------------------------------------------------------------------------ No results for input(s): VITAMINB12, FOLATE, FERRITIN, TIBC, IRON, RETICCTPCT in the last 72 hours.  Coagulation profile No results for input(s): INR, PROTIME in the last 168 hours.  No results for input(s): DDIMER in the last 72 hours.  Cardiac Enzymes No results for input(s): CKMB, TROPONINI, MYOGLOBIN in the last 168 hours.  Invalid input(s): CK ------------------------------------------------------------------------------------------------------------------ Invalid input(s): POCBNP   CBG: No results for input(s): GLUCAP in the last 168 hours.     EKG: pending   Assessment/Plan Principal Problem: Suspected acute blood loss anemia: Patient with minimal symptoms except for fatigue and intermittent abdominal pain. Taking note of previous history of GI bleed. Hemoglobin level of 6.6 on admission. Transfused 1 unit -Recheck H&H one hour after transfusion if less than 7 transfused another unit of PRBC  -recheck stool guaiac -Will consult GI in a.m. if patient's hemoglobins continued to drop  Abdominal pain: Patient describes as a intermittent dull pain on the left-hand side. Differential includes PUD versus diverticulosis versus diverticulitis as this may explain patient's symptoms and could possibly be related to the above. -would check CT scan of abdomen without contrast   Leukocytosis: Patient has a leukocytosis of 15.4 and no previous  history of fever, but note history pf steroid and the fact that she is immunocompromised -Checking blood cultures 2 this patient is immunocompromised  - recheck CBC in am  Lupus: Stable -Continue CellCept and prednisone   Hypokalemia: Acute. Present on admission potassium noted to be 3.3 -Replaced with 40 mEq of potassium chloride -Rechecking potassium in a.m.    GERD (gastroesophageal reflux disease): Stable -Continue Protonix by mouth twice a day  Gout: Stable  -continue allopurinol   Code Status:   full Family Communication: bedside Disposition Plan: admit   Total time spent 55 minutes.Greater than 50% of this time was spent in counseling, explanation of diagnosis, planning of further management, and coordination of care  Clydie Braun [facility] Pager [phone]  If 7PM-7AM, please contact night-coverage [email or web address] Password [email or web address] 09/29/2015, 10:07 PM

## 2015-09-30 ENCOUNTER — Inpatient Hospital Stay (HOSPITAL_COMMUNITY): Payer: 59

## 2015-09-30 DIAGNOSIS — M109 Gout, unspecified: Secondary | ICD-10-CM | POA: Diagnosis not present

## 2015-09-30 DIAGNOSIS — D62 Acute posthemorrhagic anemia: Principal | ICD-10-CM

## 2015-09-30 DIAGNOSIS — M1000 Idiopathic gout, unspecified site: Secondary | ICD-10-CM

## 2015-09-30 DIAGNOSIS — E876 Hypokalemia: Secondary | ICD-10-CM | POA: Diagnosis present

## 2015-09-30 DIAGNOSIS — K219 Gastro-esophageal reflux disease without esophagitis: Secondary | ICD-10-CM | POA: Diagnosis present

## 2015-09-30 DIAGNOSIS — M329 Systemic lupus erythematosus, unspecified: Secondary | ICD-10-CM | POA: Diagnosis present

## 2015-09-30 LAB — RENAL FUNCTION PANEL
ALBUMIN: 1.8 g/dL — AB (ref 3.5–5.0)
Albumin: 1.7 g/dL — ABNORMAL LOW (ref 3.5–5.0)
Anion gap: 9 (ref 5–15)
Anion gap: 8 (ref 5–15)
BUN: 24 mg/dL — AB (ref 6–20)
BUN: 26 mg/dL — AB (ref 6–20)
CALCIUM: 8.6 mg/dL — AB (ref 8.9–10.3)
CHLORIDE: 102 mmol/L (ref 101–111)
CO2: 27 mmol/L (ref 22–32)
CO2: 27 mmol/L (ref 22–32)
Calcium: 8.7 mg/dL — ABNORMAL LOW (ref 8.9–10.3)
Chloride: 102 mmol/L (ref 101–111)
Creatinine, Ser: 3.19 mg/dL — ABNORMAL HIGH (ref 0.44–1.00)
Creatinine, Ser: 3.53 mg/dL — ABNORMAL HIGH (ref 0.44–1.00)
GFR calc Af Amer: 17 mL/min — ABNORMAL LOW (ref >60)
GFR calc Af Amer: 15 mL/min — ABNORMAL LOW (ref >60)
GFR calc non Af Amer: 15 mL/min — ABNORMAL LOW (ref >60)
GFR calc non Af Amer: 13 mL/min — ABNORMAL LOW (ref >60)
GLUCOSE: 86 mg/dL (ref 65–99)
GLUCOSE: 94 mg/dL (ref 65–99)
PHOSPHORUS: 2.7 mg/dL (ref 2.5–4.6)
POTASSIUM: 3.6 mmol/L (ref 3.5–5.1)
Phosphorus: 2.6 mg/dL (ref 2.5–4.6)
Potassium: 3.7 mmol/L (ref 3.5–5.1)
SODIUM: 137 mmol/L (ref 135–145)
Sodium: 138 mmol/L (ref 135–145)

## 2015-09-30 LAB — CBC
HCT: 23.5 % — ABNORMAL LOW (ref 36.0–46.0)
HEMATOCRIT: 22.6 % — AB (ref 36.0–46.0)
Hemoglobin: 7.3 g/dL — ABNORMAL LOW (ref 12.0–15.0)
Hemoglobin: 7.5 g/dL — ABNORMAL LOW (ref 12.0–15.0)
MCH: 28.6 pg (ref 26.0–34.0)
MCH: 28.2 pg (ref 26.0–34.0)
MCHC: 32.3 g/dL (ref 30.0–36.0)
MCHC: 31.9 g/dL (ref 30.0–36.0)
MCV: 88.6 fL (ref 78.0–100.0)
MCV: 88.3 fL (ref 78.0–100.0)
PLATELETS: 321 10*3/uL (ref 150–400)
Platelets: 332 10*3/uL (ref 150–400)
RBC: 2.55 MIL/uL — ABNORMAL LOW (ref 3.87–5.11)
RBC: 2.66 MIL/uL — ABNORMAL LOW (ref 3.87–5.11)
RDW: 18.4 % — AB (ref 11.5–15.5)
RDW: 18.6 % — AB (ref 11.5–15.5)
WBC: 11.5 10*3/uL — ABNORMAL HIGH (ref 4.0–10.5)
WBC: 13.5 10*3/uL — ABNORMAL HIGH (ref 4.0–10.5)

## 2015-09-30 LAB — HEMOGLOBIN AND HEMATOCRIT, BLOOD
HCT: 20.3 % — ABNORMAL LOW (ref 36.0–46.0)
Hemoglobin: 6.4 g/dL — CL (ref 12.0–15.0)

## 2015-09-30 LAB — PREPARE RBC (CROSSMATCH)

## 2015-09-30 MED ORDER — BOOST / RESOURCE BREEZE PO LIQD
1.0000 | Freq: Three times a day (TID) | ORAL | Status: DC
  Administered 2015-09-30: 1 via ORAL

## 2015-09-30 MED ORDER — SEVELAMER CARBONATE 800 MG PO TABS
2400.0000 mg | ORAL_TABLET | Freq: Three times a day (TID) | ORAL | Status: DC
  Administered 2015-09-30 – 2015-10-01 (×3): 2400 mg via ORAL
  Filled 2015-09-30 (×4): qty 3

## 2015-09-30 MED ORDER — BOOST / RESOURCE BREEZE PO LIQD
1.0000 | ORAL | Status: DC
Start: 2015-10-01 — End: 2015-10-01
  Administered 2015-10-01: 1 via ORAL

## 2015-09-30 MED ORDER — NEPRO/CARBSTEADY PO LIQD: 237.0000 mL | Freq: Two times a day (BID) | ORAL | Status: DC

## 2015-09-30 NOTE — Progress Notes (Addendum)
Initial Nutrition Assessment  DOCUMENTATION CODES:   Not applicable  INTERVENTION:  Provide Boost Breeze po once daily, each supplement provides 250 kcal and 9 grams of protein.  Continue Nepro Shake po BID, each supplement provides 425 kcal and 19 grams protein  Encourage adequate PO intake.   NUTRITION DIAGNOSIS:   Increased nutrient needs related to chronic illness as evidenced by estimated needs.  GOAL:   Patient will meet greater than or equal to 90% of their needs  MONITOR:   PO intake, Supplement acceptance, Weight trends, Labs, I & O's  REASON FOR ASSESSMENT:   Consult Assessment of nutrition requirement/status  ASSESSMENT:   59 year old female w/ past medical history significant for Lupus w/ end-stage renal disease on HD M/W/F, gout, anemia, obesity s/p gastric bypass; who presents after being called by the dialysis unit today stating that her blood counts were low and to proceed to the ED to get another blood transfusion.   Pt reports appetite has just been "ok". She reports she has been experiencing early satiety at meals, thus has been consuming multiple small meals a day to aid in adequate nutrition. Pt reports weight loss with unknown cause for the loss. Per Epic weight records, pt with a 10% weight loss in 1 month. Pt reports she usually consumes Ensure at home at least 1-2 daily. Pt currently has Boost Breeze ordered TID and Nepro shake ordered BID. RD to modify orders to aid in fluid restriction. Pt was encouraged to eat her food at meals.   Pt with no observed significant fat or muscle mass loss.   Labs and medications reviewed.   Diet Order:  Diet renal with fluid restriction Fluid restriction:: 1200 mL Fluid; Room service appropriate?: Yes; Fluid consistency:: Thin  Skin:   (+3 LE edema)  Last BM:  11/11  Height:   Ht Readings from Last 1 Encounters:  09/29/15 5\' 1"  (1.549 m)    Weight:   Wt Readings from Last 1 Encounters:  09/29/15 148 lb  (67.132 kg)    Ideal Body Weight:  47.7 kg  BMI:  Body mass index is 27.98 kg/(m^2).  Estimated Nutritional Needs:   Kcal:  1850-2050  Protein:  90-100 grams  Fluid:  1.2 L/day  EDUCATION NEEDS:   No education needs identified at this time  Roslyn SmilingStephanie Markela Wee, MS, RD, LDN Pager # [phone] After hours/ weekend pager # [phone]

## 2015-09-30 NOTE — Procedures (Signed)
Patient was seen on dialysis and the procedure was supervised.  BFR 325  Via AVG BP is  105/60.   Patient appears to be tolerating treatment well  [name] A 09/30/2015

## 2015-09-30 NOTE — Consult Note (Signed)
[facility] Renal Consultation Note    Indication for Consultation:  Management of ESRD/hemodialysis; anemia, hypertension/volume and secondary hyperparathyroidism PCP: [name], MD   HPI: [name] is a 59 y.o. female with ESRD who had hemodialysis M,W,F at [facility]. Past medical history significant for lupus with nephritis, DVT, morbid obesity with gastric bypass, gout, OSA, hyperlipidemia, GI bleed, anemia of chronic disease, secondary hyperparathyroidism. She has had recent lupus flare and has been placed on prednisone. Has required numerous transfusions in the past six months although source of blood loss has not been found. Patient is being followed by GI.   Patient was brought to emergency room after hemodialysis yesterday after being notified of hemoglobin of 6.6. FOBT in ED was positive. Patient has now received two units of PRBC. Patient states that she has intermittent L lower quadrant abdominal pain with associated early satiety,says she had low grades temperatures here and fatigue. Denies chills, nausea, vomiting, diarrhea, hematochezia, tarry stools, URI symptoms, chest pain, SOB, DOE, Orthopnea.   Had hemodialysis yesterday, left under EDW however, has 2-3 plus lower extremity edema now. She attends HD regularly. Last incenter labs values as follows: HGB 6.6, (09/27/15) Ca 8.4 C Ca 9.5. (09/27/15) PTH 202 (09/01/15) Has been on 3.5 Ca bath and taking Ca Acetate binders.    Past Medical History  Diagnosis Date  . Lupus (HCC) lupus with nephritis  . Chronic kidney disease   . Morbid obesity (HCC)   . DVT (deep venous thrombosis) (HCC)   . Gout   . Anemia   . Tachycardia   . GI (gastrointestinal bleed)   . Hyperlipidemia   . Gastric bypass status for obesity   . Sleep apnea, obstructive     does not use CPAP   Past Surgical History  Procedure Laterality Date  . Gastric bypass    . Inferior vena cava angioplasty / stenting    .  Abdominal hysterectomy    . Laparoscopic gastric bypass  2010  . Av fistula placement  11/06/2011    Procedure: INSERTION OF ARTERIOVENOUS (AV) GORE-TEX GRAFT ARM;  Surgeon: Nilda Simmer, MD;  Location: [facility] OR;  Service: Vascular;  Laterality: Left;  Susie Cassette N/A 10/31/2011    Procedure: VENOGRAM;  Surgeon: Fransisco Hertz, MD;  Location: [facility] CATH LAB;  Service: Cardiovascular;  Laterality: N/A;   Family History  Problem Relation Age of Onset  . Anesthesia problems Neg Hx   . Heart failure Mother   . Cancer Father    Social History:  reports that she quit smoking about 4 years ago. Her smoking use included Cigarettes. She has a 5 pack-year smoking history. She has never used smokeless tobacco. She reports that she does not drink alcohol or use illicit drugs. No Known Allergies Prior to Admission medications   Medication Sig Start Date End Date Taking? Authorizing Provider  allopurinol (ZYLOPRIM) 300 MG tablet Take 150 mg by mouth daily.  09/28/11  Yes Historical Provider, MD  aspirin 81 MG tablet Take 81 mg by mouth daily.   Yes Historical Provider, MD  folic acid-vitamin b complex-vitamin c-selenium-zinc (DIALYVITE) 3 MG TABS tablet Take 1 tablet by mouth daily.   Yes Historical Provider, MD  megestrol (MEGACE) 40 MG/ML suspension Take 200 mg by mouth daily.   Yes Historical Provider, MD  mycophenolate (CELLCEPT) 500 MG tablet Take 500 mg by mouth 2 (two) times daily.   Yes Historical Provider, MD  pantoprazole (PROTONIX) 40 MG tablet Take 40  mg by mouth 2 (two) times daily.    Yes Historical Provider, MD  predniSONE (DELTASONE) 20 MG tablet Take 20 mg by mouth daily with breakfast.   Yes Historical Provider, MD  vitamin B-12 (CYANOCOBALAMIN) 250 MCG tablet Take 250 mcg by mouth daily.     Yes Historical Provider, MD   Current Facility-Administered Medications  Medication Dose Route Frequency Provider Last Rate Last Dose  . 0.9 %  sodium chloride infusion   Intravenous Once Melene Planan  Floyd, DO      . allopurinol (ZYLOPRIM) tablet 150 mg  150 mg Oral Daily Clydie Braunondell A Smith, MD   150 mg at 09/30/15 0934  . aspirin EC tablet 81 mg  81 mg Oral Daily Clydie Braunondell A Smith, MD   81 mg at 09/30/15 0932  . feeding supplement (BOOST / RESOURCE BREEZE) liquid 1 Container  1 Container Oral TID BM Belkys A Regalado, MD      . HYDROcodone-acetaminophen (NORCO/VICODIN) 5-325 MG per tablet 1 tablet  1 tablet Oral Q6H PRN Rolan Lipahomas Michael Callahan, NP   1 tablet at 09/30/15 0725  . megestrol (MEGACE) 40 MG/ML suspension 200 mg  200 mg Oral Daily Rondell A Katrinka BlazingSmith, MD      . multivitamin (RENA-VIT) tablet 1 tablet  1 tablet Oral QHS Rondell A Smith, MD      . mycophenolate (CELLCEPT) capsule 500 mg  500 mg Oral BID Clydie Braunondell A Smith, MD   500 mg at 09/30/15 0937  . ondansetron (ZOFRAN) tablet 4 mg  4 mg Oral Q6H PRN Clydie Braunondell A Smith, MD       Or  . ondansetron (ZOFRAN) injection 4 mg  4 mg Intravenous Q6H PRN Rondell A Katrinka BlazingSmith, MD      . pantoprazole (PROTONIX) EC tablet 40 mg  40 mg Oral BID Clydie Braunondell A Smith, MD   40 mg at 09/30/15 0933  . predniSONE (DELTASONE) tablet 20 mg  20 mg Oral Q breakfast Clydie Braunondell A Smith, MD   20 mg at 09/30/15 0933  . sevelamer carbonate (RENVELA) tablet 2,400 mg  2,400 mg Oral TID WC Pola Cornita Harbison Carlia Bomkamp, NP      . sodium chloride 0.9 % injection 3 mL  3 mL Intravenous Q12H Clydie Braunondell A Smith, MD   3 mL at 09/29/15 2220  . vitamin B-12 (CYANOCOBALAMIN) tablet 250 mcg  250 mcg Oral Daily Clydie Braunondell A Smith, MD   250 mcg at 09/30/15 0935   Labs: Basic Metabolic Panel:  Recent Labs Lab 09/29/15 1903  NA 138  K 3.3*  CL 101  CO2 26  GLUCOSE 106*  BUN 16  CREATININE 2.42*  CALCIUM 9.8   Liver Function Tests:  Recent Labs Lab 09/29/15 1903  AST 24  ALT 16  ALKPHOS 103  BILITOT 1.1  PROT 5.3*  ALBUMIN 2.2*   No results for input(s): LIPASE, AMYLASE in the last 168 hours. No results for input(s): AMMONIA in the last 168 hours. CBC:  Recent Labs Lab 09/29/15 1903  09/30/15 0056 09/30/15 0800  WBC 15.4*  --  11.5*  HGB 6.6* 6.4* 7.3*  HCT 21.1* 20.3* 22.6*  MCV 92.1  --  88.6  PLT 430*  --  332   Cardiac Enzymes: No results for input(s): CKTOTAL, CKMB, CKMBINDEX, TROPONINI in the last 168 hours. CBG: No results for input(s): GLUCAP in the last 168 hours. Iron Studies: No results for input(s): IRON, TIBC, TRANSFERRIN, FERRITIN in the last 72 hours. Studies/Results: No results found.  ROS: As per HPI  otherwise negative.   Physical Exam: Filed Vitals:   09/30/15 0336 09/30/15 0406 09/30/15 0613 09/30/15 0817  BP: 99/56 109/58 110/60 106/63  Pulse: 100 100 105 96  Temp: 99.6 F (37.6 C) 98.8 F (37.1 C) 99 F (37.2 C) 98 F (36.7 C)  TempSrc: Oral Oral Oral Oral  Resp: Height:      Weight:      SpO2: 98% 100% 100% 100%     General: Chronically ill appearing female in NAD Head: Normocephalic, atraumatic, sclera non-icteric, mucus membranes are moist. Ocular edema and facial edema noted. Neck: Supple. JVD not elevated. Lungs: Clear bilaterally to auscultation without wheezes, rales, or rhonchi. Breathing is unlabored. Heart: RRR with S1 S2. No murmurs, rubs, or gallops appreciated. Abdomen: Soft, non-tender, non-distended with normoactive bowel sounds. No rebound/guarding. No obvious abdominal masses. Lower extremities: 2+ pitting edema bilateral LE.  Neuro: Alert and oriented X 3. Moves all extremities spontaneously. Psych: Responds to questions appropriately with a flat affect. Dialysis Access: LUA AVF + thrill + Bruit   Dialysis Orders: Center:[facility] on MWF . EDW 72 kg HD Bath 2.0 K 3.5 Ca+ Time 4 hours Heparin 2600 units q tx. Access LUA AVF BFR 400 DFR 800  Mircera 225 mcg IV q 2 weeks (last dose 09/27/2015) Calcitriol 1.5 mcg PO MWF (last dose 09/27/2015)  Assessment/Plan: 1.  Suspected Blood Loss Anemia:  Per primary. Will check CBC prior to HD and transfuse if less than 7.0 2.  Leukocytosis: WRC 15.4 now  11.5. BC X 2 done per primary. MRSA PCR negative 3.  ESRD -  MWF at [facility]. Will have HD today due to increased LE edema. K+ 3.6. Will run on 4.0 K bath.  4.  Hypertension/volume  - BP volume controlled. No home BP meds.  5.  Anemia  - As noted above. On max ESA therapy outpatient. Last dose of Mircera 225 mcg 09/27/15 6.  Metabolic bone disease -  Has been on 3.5 Ca bath due to low Ca as OP. However Ca 9.8 C Ca 11.24. Will change Ca acetate binders to Renvela. HD on 2.25 Ca+ bath.  7.  Nutrition - Albumin 2.2. On Megace for appetite stimulation. Renal diet, add nepro.  8.  Lupus: continue prednisone and Cellcept.    [place] Wadas H. Manson Passey, NP-C 09/30/2015, 9:44 AM  [employer] [phone]

## 2015-09-30 NOTE — Progress Notes (Signed)
[facility] PROGRESS NOTE  Kimberly FerriesGloria J [name] WUJ:[id number] DOB: 03/05/1956 DOA: 09/29/2015 PCP: [name]  Assessment/Plan: 59 year old female w/ past medical history significant for Lupus w/ end-stage renal disease on HD M/W/F, gout, anemia, obesity s/p gastric bypass; who presents after being called by the dialysis unit today stating that her blood counts were low and to proceed to the ED to get another blood transfusion. She does endorse intermittent left lower quadrant abdominal pain, chills, fatigue, 40 pound weight loss in the last 3 months, and early satiety.  Suspected blood loss anemia vs anemia of chronic diseases. : Patient with minimal symptoms except for fatigue and intermittent abdominal pain.  Hemoglobin level of 6.6 on admission.  S/P 2 unit PRBC -stool guaiac positive -She denies melena, hematochezia. She has had prior GI evaluation: recent colonoscopy sept -21016, also CT scans and endoscopy./  Monitor HB and sign for bleeding.  Hold aspirin.   ESRD; renal consulted. Will probably need transfusion with dialysis/   Abdominal pain: Patient describes as a intermittent dull pain on the left-hand side. Has had work up by GI; endoscopy, colonoscopy, CT scan 06-2015.  Will check KUB.   Leukocytosis: Patient has a leukocytosis of 15.4 and no previous history of fever, but note history pf steroid and the fact that she is immunocompromised -blood cultures 2 this patient is immunocompromised  - recheck CBC in am -trending down.   Lupus: Stable -Continue CellCept and prednisone    GERD (gastroesophageal reflux disease): Stable -Continue Protonix by mouth twice a day  Gout: Stable  -continue allopurinol   DVT prophylaxis: SCD no anticoagulation in case of GI bleed.   Code Status: Full Code.  Family Communication: care discussed with patient  Disposition Plan: Remain inpatient     Consultants:  nephrology  Procedures:  none  Antibiotics:  none  HPI/Subjective: No abdominal pain, denies melena, hematochezia.  She follows with Dr [name] GI. He has done Endoscopy, colonoscopy CT scans,.   Objective: Filed Vitals:   09/30/15 1230  BP: 133/77  Pulse: 92  Temp:   Resp: 16    Intake/Output Summary (Last 24 hours) at 09/30/15 1313 Last data filed at 09/30/15 [id number]  Gross per 24 hour  Intake    665 ml  Output      0 ml  Net    665 ml   Filed Weights   09/29/15 2202 09/30/15 1150  Weight: 67.132 kg (148 lb) 73.2 kg (161 lb 6 oz)    Exam:   General:  NAD  Cardiovascular: S 1, S 2 RRR  Respiratory: CTA  Abdomen: BS present, soft, nt  Musculoskeletal: no edema.   Data Reviewed: Basic Metabolic Panel:  Recent Labs Lab 09/29/15 1903 09/30/15 0800 09/30/15 1216  NA 138 138 137  K 3.3* 3.6 3.7  CL 101 102 102  CO2 26 27 27   GLUCOSE 106* 86 94  BUN 16 24* 26*  CREATININE 2.42* 3.19* 3.53*  CALCIUM 9.8 8.7* 8.6*  PHOS  --  2.6 2.7   Liver Function Tests:  Recent Labs Lab 09/29/15 1903 09/30/15 0800 09/30/15 1216  AST 24  --   --   ALT 16  --   --   ALKPHOS 103  --   --   BILITOT 1.1  --   --   PROT 5.3*  --   --   ALBUMIN 2.2* 1.7* 1.8*   No results for input(s): LIPASE, AMYLASE in the last 168 hours. No results for input(s):  AMMONIA in the last 168 hours. CBC:  Recent Labs Lab 09/29/15 1903 09/30/15 0056 09/30/15 0800 09/30/15 1216  WBC 15.4*  --  11.5* 13.5*  HGB 6.6* 6.4* 7.3* 7.5*  HCT 21.1* 20.3* 22.6* 23.5*  MCV 92.1  --  88.6 88.3  PLT 430*  --  332 321   Cardiac Enzymes: No results for input(s): CKTOTAL, CKMB, CKMBINDEX, TROPONINI in the last 168 hours. BNP (last 3 results) No results for input(s): BNP in the last 8760 hours.  ProBNP (last 3 results) No results for input(s): PROBNP in the last 8760 hours.  CBG: No results for input(s): GLUCAP in the last 168 hours.  No results found for  this or any previous visit (from the past 240 hour(s)).   Studies: No results found.  Scheduled Meds: . sodium chloride   Intravenous Once  . allopurinol  150 mg Oral Daily  . aspirin EC  81 mg Oral Daily  . [START ON 10/01/2015] feeding supplement  1 Container Oral Q24H  . feeding supplement (NEPRO CARB STEADY)  237 mL Oral BID BM  . megestrol  200 mg Oral Daily  . multivitamin  1 tablet Oral QHS  . mycophenolate  500 mg Oral BID  . pantoprazole  40 mg Oral BID  . predniSONE  20 mg Oral Q breakfast  . sevelamer carbonate  2,400 mg Oral TID WC  . sodium chloride  3 mL Intravenous Q12H  . vitamin B-12  250 mcg Oral Daily   Continuous Infusions:   Principal Problem:   Acute blood loss anemia Active Problems:   Lupus (HCC)   Gout   Hypokalemia   GERD (gastroesophageal reflux disease)    Time spent: 25 minutes.     Hartley Barefoot A  [facility] Pager [phone]. If 7PM-7AM, please contact night-coverage at [email or web address], password [facility] 09/30/2015, 1:13 PM  LOS: 1 day

## 2015-10-01 DIAGNOSIS — E876 Hypokalemia: Secondary | ICD-10-CM

## 2015-10-01 DIAGNOSIS — M329 Systemic lupus erythematosus, unspecified: Secondary | ICD-10-CM

## 2015-10-01 LAB — CBC
HCT: 25 % — ABNORMAL LOW (ref 36.0–46.0)
Hemoglobin: 8.2 g/dL — ABNORMAL LOW (ref 12.0–15.0)
MCH: 29.1 pg (ref 26.0–34.0)
MCHC: 32.8 g/dL (ref 30.0–36.0)
MCV: 88.7 fL (ref 78.0–100.0)
PLATELETS: 306 10*3/uL (ref 150–400)
RBC: 2.82 MIL/uL — AB (ref 3.87–5.11)
RDW: 19 % — ABNORMAL HIGH (ref 11.5–15.5)
WBC: 13.8 10*3/uL — AB (ref 4.0–10.5)

## 2015-10-01 LAB — HEPATITIS B SURFACE ANTIGEN: Hepatitis B Surface Ag: NEGATIVE

## 2015-10-01 MED ORDER — DOXYCYCLINE HYCLATE 50 MG PO CAPS: 100.0000 mg | ORAL_CAPSULE | Freq: Two times a day (BID) | ORAL | Status: AC

## 2015-10-01 MED ORDER — RENA-VITE PO TABS: 1.0000 | ORAL_TABLET | Freq: Every day | ORAL | Status: AC

## 2015-10-01 MED ORDER — SACCHAROMYCES BOULARDII 250 MG PO CAPS: 250.0000 mg | ORAL_CAPSULE | Freq: Two times a day (BID) | ORAL | Status: AC

## 2015-10-01 MED ORDER — HYDROCODONE-ACETAMINOPHEN 5-325 MG PO TABS: 1.0000 | ORAL_TABLET | Freq: Four times a day (QID) | ORAL | Status: AC | PRN

## 2015-10-01 MED ORDER — NEPRO/CARBSTEADY PO LIQD: 237.0000 mL | Freq: Two times a day (BID) | ORAL | Status: AC

## 2015-10-01 NOTE — Care Management (Signed)
Patient being discharged home in stable condition today. Denies any difficulties obtaining monthly medications. No CM needs identified.

## 2015-10-01 NOTE — Discharge Summary (Signed)
Physician Discharge Summary  Kimberly FerriesGloria J Foulks BJY:[id number] DOB: 12/28/1955 DOA: 09/29/2015  PCP: Cala BradfordWHITE,Kimberly S, MD  Admit date: 09/29/2015 Discharge date: 10/01/2015  Time spent: 35 minutes  Recommendations for Outpatient Follow-up:  1. Needs CBC to follow hb. Needs to follow up with GI.     Discharge Diagnoses:    Severe anemia   Acute blood loss anemia   Lupus (HCC)   Gout   Hypokalemia   GERD (gastroesophageal reflux disease)   Discharge Condition: stable.   Diet recommendation: regular diet  Filed Weights   09/29/15 2202 09/30/15 1150  Weight: 67.132 kg (148 lb) 73.2 kg (161 lb 6 oz)    History of present illness:  59 year old female w/ past medical history significant for Lupus w/ end-stage renal disease on HD M/W/F, gout, anemia, obesity s/p gastric bypass; who presents after being called by the dialysis unit today stating that her blood counts were low and to proceed to the ED to get another blood transfusion. She states that she has had issues with her blood counts for some time and attributes it to her history of lupus which also caused her kidney problems. Patient states previous history of dark stools when she had a GI bleed previously. However, states that she's having normal stools at this time that are brown. She does endorse intermittent left lower quadrant abdominal pain, chills, fatigue, 40 pound weight loss in the last 3 months, and early satiety. Patient denies any fever, chest pain, and chest pain, muscle aches, shortness of breath, or feelings that she is sick. The ED physician performed a rectal exam and found this to be guaiac positive.  Hospital Course:  59 year old female w/ past medical history significant for Lupus w/ end-stage renal disease on HD M/W/F, gout, anemia, obesity s/p gastric bypass; who presents after being called by the dialysis unit today stating that her blood counts were low and to proceed to the ED to get another blood transfusion.  She does endorse intermittent left lower quadrant abdominal pain, chills, fatigue, 40 pound weight loss in the last 3 months, and early satiety.  Suspected blood loss anemia vs anemia of chronic diseases. : Patient with minimal symptoms except for fatigue and intermittent abdominal pain. Hemoglobin level of 6.6 on admission. S/P 2 unit PRBC -stool guaiac positive -She denies melena, hematochezia. She has had prior GI evaluation: recent colonoscopy sept -21016, also CT scans and endoscopy./  Monitor HB and sign for bleeding.  Hold aspirin.  Hb increase to 8, stable this am. Needs to follow up with GI, hematology .   ESRD; renal consulted. Will probably need transfusion with dialysis/   Abdominal pain: Patient describes as a intermittent dull pain on the left-hand side. Has had work up by GI; endoscopy, colonoscopy, CT scan 06-2015.  KUB negative  Leukocytosis: Patient has a leukocytosis of 15.4 and no previous history of fever, but note history pf steroid and the fact that she is immunocompromised -blood cultures 2 this patient is immunocompromised  - stable.   Left LE induration; after trauma. Mild redness. Will give short course of doxy/. mild leukocytosis.  Lupus: Stable -Continue CellCept and prednisone    GERD (gastroesophageal reflux disease): Stable -Continue Protonix by mouth twice a day  Gout: Stable  -continue allopurinol   Procedures:  Dialysis   Consultations: Renal      Discharge Exam: Filed Vitals:   10/01/15 0856  BP: 99/54  Pulse: 98  Temp: 97.8 F (36.6 C)  Resp: 18  General: Alert in no distress Cardiovascular: S 1, S 2 RRR Respiratory: CTA Extremity left LE with deformity, after trauma, mild redness, tender to palpation   Discharge Instructions   Discharge Instructions    Diet - low sodium heart healthy    Complete by:  As directed      Increase activity slowly    Complete by:  As directed           Current Discharge  Medication List    START taking these medications   Details  doxycycline (VIBRAMYCIN) 50 MG capsule Take 2 capsules (100 mg total) by mouth 2 (two) times daily. Qty: 20 capsule, Refills: 0    HYDROcodone-acetaminophen (NORCO/VICODIN) 5-325 MG tablet Take 1 tablet by mouth every 6 (six) hours as needed for moderate pain. Qty: 20 tablet, Refills: 0    multivitamin (RENA-VIT) TABS tablet Take 1 tablet by mouth at bedtime. Qty: 30 tablet, Refills: 0    Nutritional Supplements (FEEDING SUPPLEMENT, NEPRO CARB STEADY,) LIQD Take 237 mLs by mouth 2 (two) times daily between meals. Qty: 30 Can, Refills: 0    saccharomyces boulardii (FLORASTOR) 250 MG capsule Take 1 capsule (250 mg total) by mouth 2 (two) times daily. Qty: 20 capsule, Refills: 0      CONTINUE these medications which have NOT CHANGED   Details  allopurinol (ZYLOPRIM) 300 MG tablet Take 150 mg by mouth daily.     aspirin 81 MG tablet Take 81 mg by mouth daily.    folic acid-vitamin b complex-vitamin c-selenium-zinc (DIALYVITE) 3 MG TABS tablet Take 1 tablet by mouth daily.    megestrol (MEGACE) 40 MG/ML suspension Take 200 mg by mouth daily.    mycophenolate (CELLCEPT) 500 MG tablet Take 500 mg by mouth 2 (two) times daily.    pantoprazole (PROTONIX) 40 MG tablet Take 40 mg by mouth 2 (two) times daily.     predniSONE (DELTASONE) 20 MG tablet Take 20 mg by mouth daily with breakfast.    vitamin B-12 (CYANOCOBALAMIN) 250 MCG tablet Take 250 mcg by mouth daily.         No Known Allergies Follow-up Information    Follow up with [name], MD In 1 week.   Specialty:  Family Medicine   Contact information:   [phone] [place] [address] [phone]        The results of significant diagnostics from this hospitalization (including imaging, microbiology, ancillary and laboratory) are listed below for reference.    Significant Diagnostic Studies: Dg Chest 2 View  09/07/2015  CLINICAL  DATA:  Acute onset of anemia. Weakness and cough. Initial encounter. EXAM: CHEST  2 VIEW COMPARISON:  Chest radiograph performed 09/20/2010 FINDINGS: The lungs are well-aerated. Mild left basilar scarring is noted. There is no evidence of focal opacification, pleural effusion or pneumothorax. The heart is borderline normal in size. No acute osseous abnormalities are seen. A vascular stent is noted overlying the proximal left arm. There is chronic superior subluxation of the right humeral head, likely reflecting remote rotator cuff tear. IMPRESSION: Mild left basilar scarring noted. No acute cardiopulmonary process seen. Electronically Signed   By: Roanna Raider M.D.   On: 09/07/2015 21:14   Dg Abd Portable 1v  09/30/2015  CLINICAL DATA:  Left lower quadrant pressure and pain, dialysis today. EXAM: PORTABLE ABDOMEN - 1 VIEW COMPARISON:  None. FINDINGS: Supine portable KUB is provided. Visualized bowel gas pattern is grossly nonobstructive. No evidence of soft tissue mass or abnormal fluid  collection. No evidence of free intraperitoneal air. Degenerative changes are seen throughout the scoliotic thoracolumbar spine. No acute osseous abnormality. IMPRESSION: Nonobstructive bowel gas pattern.  No acute findings. Electronically Signed   By: Bary Richard M.D.   On: 09/30/2015 18:39    Microbiology: No results found for this or any previous visit (from the past 240 hour(s)).   Labs: Basic Metabolic Panel:  Recent Labs Lab 09/29/15 1903 09/30/15 0800 09/30/15 1216  NA 138 138 137  K 3.3* 3.6 3.7  CL 101 102 102  CO2 GLUCOSE 106* 86 94  BUN 16 24* 26*  CREATININE 2.42* 3.19* 3.53*  CALCIUM 9.8 8.7* 8.6*  PHOS  --  2.6 2.7   Liver Function Tests:  Recent Labs Lab 09/29/15 1903 09/30/15 0800 09/30/15 1216  AST 24  --   --   ALT 16  --   --   ALKPHOS 103  --   --   BILITOT 1.1  --   --   PROT 5.3*  --   --   ALBUMIN 2.2* 1.7* 1.8*   No results for input(s): LIPASE, AMYLASE  in the last 168 hours. No results for input(s): AMMONIA in the last 168 hours. CBC:  Recent Labs Lab 09/29/15 1903 09/30/15 0056 09/30/15 0800 09/30/15 1216 10/01/15 0900  WBC 15.4*  --  11.5* 13.5* 13.8*  HGB 6.6* 6.4* 7.3* 7.5* 8.2*  HCT 21.1* 20.3* 22.6* 23.5* 25.0*  MCV 92.1  --  88.6 88.3 88.7  PLT 430*  --  332 321 306   Cardiac Enzymes: No results for input(s): CKTOTAL, CKMB, CKMBINDEX, TROPONINI in the last 168 hours. BNP: BNP (last 3 results) No results for input(s): BNP in the last 8760 hours.  ProBNP (last 3 results) No results for input(s): PROBNP in the last 8760 hours.  CBG: No results for input(s): GLUCAP in the last 168 hours.     SignedHartley Barefoot A  [facility] 10/01/2015, 10:44 AM

## 2015-10-01 NOTE — Progress Notes (Signed)
Patient's discharged papers printed,explained and given to the patient.Reminded of her pending medical appointment.Questions were answered satisfactorily at the time of discharged.Patient is stable and no complaint.No open skin issue at the time of discharged.

## 2015-10-01 NOTE — Progress Notes (Signed)
Patient ID: Kimberly FerriesGloria J Marcil, female   DOB: 05/28/1956, 59 y.o.   MRN: [id number]  [facility] Progress Note   Assessment/ Plan:   1. Suspected Blood Loss Anemia (GI source): This is based on her history and she has not noted any obvious hematochezia or melena. She feels better following blood transfusion (1 unit) and did not get transfused yesterday because her hemoglobin was 7.5. Awaiting hemoglobin rechecked today. 2. Leukocytosis: Possibly associated with corticosteroids-culture data negative so far. 3. [facility]. Underwent additional hemodialysis yesterday for volume excess that she tolerated without problems (current weight 73.2 kg-  EDW 72 kg).  4. Hypertension/volume - blood pressures appear to be on the softer side-volume status improved following dialysis 5. Anemia - As noted above. On max ESA therapy outpatient. Last dose of Mircera 225 mcg 09/27/15 6. Metabolic bone disease - switched over to Renvela from calcium acetate because of hypercalcemia.  7. Nutrition - Albumin 2.2. On Megace for appetite stimulation. Renal diet, add nepro.  8. Lupus: continue prednisone and Cellcept.  If hemoglobin is stable and there are no other acute reasons to keep her here in the hospital-agreeable with discharge  Subjective:   Complains of pain in the left shin-she bumped her leg a few weeks ago on a car door and has had a localized swelling/pain since then.    Objective:   BP 98/56 mmHg  Pulse 102  Temp(Src) 99.2 F (37.3 C) (Oral)  Resp 17  Ht 5\' 1"  (1.549 m)  Wt 73.2 kg (161 lb 6 oz)  BMI 30.51 kg/m2  SpO2 100%  Physical Exam: Gen: Comfortably resting in bed CVS: Pulse regular tachycardia, S1 and S2 normal Resp: Clear to auscultation, no distinct rales or rhonchi Abd: Soft, obese, nontender Ext: Trace ankle edema, left pretibial localized hard/tender swelling consistent with periosteal hematoma.  Labs: BMET  Recent Labs Lab 09/29/15 1903  09/30/15 0800 09/30/15 1216  NA 138 138 137  K 3.3* 3.6 3.7  CL 101 102 102  CO2 26 27 27   GLUCOSE 106* 86 94  BUN 16 24* 26*  CREATININE 2.42* 3.19* 3.53*  CALCIUM 9.8 8.7* 8.6*  PHOS  --  2.6 2.7   CBC  Recent Labs Lab 09/29/15 1903 09/30/15 0056 09/30/15 0800 09/30/15 1216  WBC 15.4*  --  11.5* 13.5*  HGB 6.6* 6.4* 7.3* 7.5*  HCT 21.1* 20.3* 22.6* 23.5*  MCV 92.1  --  88.6 88.3  PLT 430*  --  332 321   Medications:    . sodium chloride   Intravenous Once  . allopurinol  150 mg Oral Daily  . feeding supplement  1 Container Oral Q24H  . feeding supplement (NEPRO CARB STEADY)  237 mL Oral BID BM  . megestrol  200 mg Oral Daily  . multivitamin  1 tablet Oral QHS  . mycophenolate  500 mg Oral BID  . pantoprazole  40 mg Oral BID  . predniSONE  20 mg Oral Q breakfast  . sevelamer carbonate  2,400 mg Oral TID WC  . sodium chloride  3 mL Intravenous Q12H  . vitamin B-12  250 mcg Oral Daily   Zetta BillsJay Gabrielle Wakeland, MD 10/01/2015, 8:42 AM

## 2015-10-02 LAB — TYPE AND SCREEN
ABO/RH(D): A POS
ANTIBODY SCREEN: NEGATIVE
UNIT DIVISION: 0
Unit division: 0
Unit division: 0

## 2015-10-05 LAB — CULTURE, BLOOD (ROUTINE X 2)
CULTURE: NO GROWTH
Culture: NO GROWTH

## 2015-10-06 ENCOUNTER — Ambulatory Visit
Admission: RE | Admit: 2015-10-06 | Discharge: 2015-10-06 | Disposition: A | Discharge status: Home / Self Care | Payer: 59 | Source: Ambulatory Visit | Attending: Family Medicine | Admitting: Family Medicine

## 2015-10-06 ENCOUNTER — Other Ambulatory Visit: Payer: Self-pay | Admitting: Family Medicine

## 2015-10-06 DIAGNOSIS — R634 Abnormal weight loss: Secondary | ICD-10-CM

## 2015-10-19 DEATH — deceased

## 2015-10-26 ENCOUNTER — Telehealth: Payer: Self-pay | Admitting: Hematology & Oncology

## 2015-10-26 NOTE — Telephone Encounter (Signed)
Patient's family member called and cx 11/02/15 New patient apt due to patient passing away

## 2015-11-02 ENCOUNTER — Ambulatory Visit: Payer: 59

## 2015-11-02 ENCOUNTER — Other Ambulatory Visit: Payer: 59

## 2015-11-02 ENCOUNTER — Ambulatory Visit: Payer: 59 | Admitting: Hematology & Oncology

## 2015-12-06 ENCOUNTER — Encounter: Payer: 59 | Admitting: Hematology & Oncology

## 2015-12-06 ENCOUNTER — Other Ambulatory Visit: Payer: 59

## 2015-12-06 ENCOUNTER — Ambulatory Visit: Payer: 59

## 2015-12-21 NOTE — Progress Notes (Signed)
This encounter was created in error - please disregard.

## 2016-08-30 ENCOUNTER — Encounter (HOSPITAL_COMMUNITY): Payer: Self-pay
